# Patient Record
Sex: Female | Born: 1960 | Race: White | Hispanic: No | Marital: Single | Smoking: Never smoker
Health system: Southern US, Community
[De-identification: ages and names within clinical notes are randomized; demographics above are authoritative.]

## PROBLEM LIST (undated history)

## (undated) DIAGNOSIS — E119 Type 2 diabetes mellitus without complications: Secondary | ICD-10-CM

## (undated) DIAGNOSIS — K219 Gastro-esophageal reflux disease without esophagitis: Secondary | ICD-10-CM

## (undated) DIAGNOSIS — F329 Major depressive disorder, single episode, unspecified: Secondary | ICD-10-CM

## (undated) DIAGNOSIS — F419 Anxiety disorder, unspecified: Secondary | ICD-10-CM

## (undated) DIAGNOSIS — T783XXA Angioneurotic edema, initial encounter: Secondary | ICD-10-CM

## (undated) DIAGNOSIS — M1990 Unspecified osteoarthritis, unspecified site: Secondary | ICD-10-CM

## (undated) DIAGNOSIS — F32A Depression, unspecified: Secondary | ICD-10-CM

## (undated) DIAGNOSIS — G4730 Sleep apnea, unspecified: Secondary | ICD-10-CM

## (undated) DIAGNOSIS — I10 Essential (primary) hypertension: Secondary | ICD-10-CM

## (undated) DIAGNOSIS — D649 Anemia, unspecified: Secondary | ICD-10-CM

## (undated) DIAGNOSIS — L509 Urticaria, unspecified: Secondary | ICD-10-CM

## (undated) DIAGNOSIS — I499 Cardiac arrhythmia, unspecified: Secondary | ICD-10-CM

## (undated) DIAGNOSIS — R112 Nausea with vomiting, unspecified: Secondary | ICD-10-CM

## (undated) DIAGNOSIS — Z9889 Other specified postprocedural states: Secondary | ICD-10-CM

## (undated) DIAGNOSIS — C801 Malignant (primary) neoplasm, unspecified: Secondary | ICD-10-CM

## (undated) HISTORY — PX: CARPAL TUNNEL RELEASE: SHX101

## (undated) HISTORY — PX: BACK SURGERY: SHX140

## (undated) HISTORY — PX: TONSILLECTOMY: SUR1361

## (undated) HISTORY — PX: OTHER SURGICAL HISTORY: SHX169

## (undated) HISTORY — DX: Urticaria, unspecified: L50.9

## (undated) HISTORY — PX: KNEE ARTHROSCOPY: SUR90

## (undated) HISTORY — PX: OOPHORECTOMY: SHX86

## (undated) HISTORY — DX: Angioneurotic edema, initial encounter: T78.3XXA

---

## 2004-05-02 ENCOUNTER — Encounter: Admission: RE | Admit: 2004-05-02 | Discharge: 2004-05-02 | Payer: Self-pay | Admitting: Neurology

## 2006-04-30 ENCOUNTER — Inpatient Hospital Stay (HOSPITAL_COMMUNITY): Admission: RE | Admit: 2006-04-30 | Discharge: 2006-05-01 | Payer: Self-pay | Admitting: Neurosurgery

## 2006-07-15 ENCOUNTER — Encounter: Admission: RE | Admit: 2006-07-15 | Discharge: 2006-07-15 | Payer: Self-pay | Admitting: Neurosurgery

## 2007-08-03 ENCOUNTER — Ambulatory Visit: Payer: Self-pay | Admitting: Cardiology

## 2007-08-04 ENCOUNTER — Ambulatory Visit: Payer: Self-pay | Admitting: Cardiology

## 2007-08-10 ENCOUNTER — Ambulatory Visit: Payer: Self-pay | Admitting: Cardiology

## 2007-09-15 ENCOUNTER — Ambulatory Visit: Payer: Self-pay | Admitting: Cardiology

## 2008-11-09 DIAGNOSIS — E663 Overweight: Secondary | ICD-10-CM | POA: Insufficient documentation

## 2008-11-09 DIAGNOSIS — R9431 Abnormal electrocardiogram [ECG] [EKG]: Secondary | ICD-10-CM | POA: Insufficient documentation

## 2008-11-09 DIAGNOSIS — R002 Palpitations: Secondary | ICD-10-CM | POA: Insufficient documentation

## 2008-11-09 DIAGNOSIS — I10 Essential (primary) hypertension: Secondary | ICD-10-CM | POA: Insufficient documentation

## 2008-11-09 DIAGNOSIS — E7800 Pure hypercholesterolemia, unspecified: Secondary | ICD-10-CM | POA: Insufficient documentation

## 2010-06-11 NOTE — Assessment & Plan Note (Signed)
[facility]                          [facility] NOTE   NAME:Theresa Horton, Theresa Horton                      MRN:          [id number]  DATE:08/03/2007                            DOB:          Jun 17, 1960    HISTORY OF PRESENT ILLNESS:  Theresa Horton is 50 years old.  There is a  family history of coronary disease.  She is overweight.  She has  diabetes, hypertension, and elevated cholesterol.  The patient has noted  some mild palpitations.  She feels some heart skipping.  She has not  had any presyncope or syncope.  She has had some limited flutter, but no  prolonged episodes.  She had some of these symptoms last week, and  because she felt them a few days in a row, she saw [employer], PA-C.  She was stable at that point.  Her EKG at that time showed normal sinus  rhythm.  There were no diagnostic EKG changes.  She had some decreased R-  wave in V2.  There were nonspecific ST-T wave changes.  Since that time,  she feels better.  She is here for further evaluation of her overall  cardiac status.   She has not had exertional chest pain.  She does have some exertional  shortness of breath that she attributes to her weight.   The patient also had an episode last week of feeling a sudden sharp left  upper chest discomfort.  This lasted only for a second or two.  There  was some mild dizziness after this.  I did not feel like her usual  symptoms with decreased glucose.  However, she had a candy bar, and she  felt much better, and has had no return of this.   PAST MEDICAL HISTORY:   ALLERGIES:  NALFON, PENICILLIN, GLYBURIDE, GLIPIZIDE, and IODINE.   MEDICATIONS:  Metformin, Cyclessa, lisinopril, Celexa, Wellbutrin,  Ativan, Prilosec, aspirin, Neurontin, and fish oil.   OTHER MEDICAL PROBLEMS:  See the complete list below.   SOCIAL HISTORY:  The patient works with Therapist, sports at a desk.  She smoked very briefly in the remote past.  She is  not smoking at this  time.  She is divorced and single.   FAMILY HISTORY:  There is a family history of coronary disease that is  significant.  Father had first heart attack at age 51.   REVIEW OF SYSTEMS:  She does have some back discomfort.  This is related  to significant lumbar disk disease that she has had overtime.  Otherwise, her review of systems is negative.   PHYSICAL EXAMINATION:  VITAL SIGNS:  Weight is 309 pounds.  Blood  pressure is 144/86 with a pulse of 81.  GENERAL:  The patient is oriented to person, time, and place.  Affect is  normal.  HEENT:  No xanthelasma.  She has normal extraocular motion.  NECK:  There are no carotid bruits.  There is no jugular venous  distention.  LUNGS:  Clear.  Respiratory effort is not labored.  CARDIAC:  S1 with an S2.  There are no clicks or  significant murmurs.  ABDOMEN:  Soft.  There are no masses or bruits.  EXTREMITIES:  She has no significant peripheral edema.  She is  significantly overweight.   EKG sent to me from July 23, 2007 reveals sinus rhythm.  There are  nonspecific ST-T wave changes.  There is decreased R-wave in V2.   PROBLEMS:  1. History of hypertension.  2. Diabetes.  3. History of elevated cholesterol.  4. Status post back surgery, ruptured disk, and recurrent disk      problems.  5. History of knee surgery.  6. History of carpal tunnel surgery.  7. History of one ovary removed in the past.  8. Significant obesity.  9. Strong family history of coronary artery disease.  10.*Recent palpitations last week, which are now resolved.  I do feel      that we should do a 24-hour Holter monitor.  She does not need an      event recorder at this point.  This will help assess her overall      basic rhythm.  She will also have a 2-D echo to assess the left      ventricular function.  11.Mildly abnormal EKG.  A 2-D echo will help [place] assess the LV      function.  After I see her back, we will decide if further testing       is needed.     Luis Abed, MD, [facility]  Electronically Signed    JDK/MedQ  DD: 08/03/2007  DT: 08/04/2007  Job #: [id number]   cc:   Roma Kayser, PA-C

## 2010-06-11 NOTE — Assessment & Plan Note (Signed)
[facility]                          [facility]   NAME:Theresa Horton, Theresa Horton                      MRN:          [id number]  DATE:09/15/2007                            DOB:          Jan 22, 1961    Theresa Horton is seen for followup.  I saw seen her in consultation on August 03, 2007.  She has had some palpitations.  She has a family history of  coronary artery disease and she has diabetes.  She wear a Holter  monitor.  She did not have symptoms, but she had sinus rhythm throughout  the entire 24-hour period.  She had a 2-D echo.  She has good LV  function.  There was a question of some diastolic dysfunction and this  will have to be kept in mind over time.  There was also the possibility  of a small patent foramen ovale.  This was seen on a single apical view  with a possibility of slight left-to-right shunting.  This was seen by  color.  This was not a major abnormality.  A bubble study could be  considered, if clinically indicated.   She is back today in stable.  She tells me she still has some mild  palpitations, but overall she is doing well.   PAST MEDICAL HISTORY:   ALLERGIES:  NALFON, PENICILLIN, GLYBURIDE, GLIPIZIDE and IODINE.   MEDICATIONS:  Ativan, metformin, iron, lisinopril, Cyclessa (birth  control pill), Celexa, Wellbutrin, Prilosec, aspirin, Neurontin, fish  oil, and multivitamin.   OTHER MEDICAL PROBLEMS:  See the list on my note of August 03, 2007.   REVIEW OF SYSTEMS:  Review of systems today is negative.   PHYSICAL EXAMINATION:  GENERAL:  The patient is oriented to person,  time, and place.  Affect is normal.  VITAL SIGNS:  Her blood pressure is 126/84.  Her heart rate is 75.  Her  weight is 311 pounds.   Problems are listed on my note of August 03, 2007.  She has good LV  function.  There is question of a small patent foramen ovale, which  requires no further followup.  I have reviewed this with her completely.  She is on  low-dose aspirin.  I believe, she is stable.  I would like to  see her back in 1 year for Cardiology followup.  If she would have  significant change in her symptoms, I will be happy to see her at  anytime.  She clearly needs aggressive primary prevention for potential  vascular disease with her known diabetes history.     Luis Abed, MD, [facility]  Electronically Signed   JDK/MedQ  DD: 09/15/2007  DT: 09/16/2007  Job #: [id number]   cc:   Donzetta Sprung

## 2010-06-14 NOTE — Op Note (Signed)
Theresa Horton, Theresa Horton               ACCOUNT NO.:  [id number]   MEDICAL RECORD NO.:  [id number]          PATIENT TYPE:  INP   LOCATION:  2899                         FACILITY:  [facility]   PHYSICIAN:  Hewitt Shorts, M.D.DATE OF BIRTH:  01-13-1961   DATE OF PROCEDURE:  04/30/2006  DATE OF DISCHARGE:                               OPERATIVE REPORT   PREOPERATIVE DIAGNOSES:  1. Left L3-4 lumbar disk herniation.  2. Lumbar degenerative disk disease.  3. Lumbar spondylosis.  4. Lumbar radiculopathy.   POSTOPERATIVE DIAGNOSES:  1. Left L3-4 lumbar disk herniation.  2. Lumbar degenerative disk disease.  3. Lumbar spondylosis.  4. Lumbar radiculopathy.   PROCEDURE:  Left L3-4 lumbar laminotomy, microdiskectomy with  microdissection.   SURGEON:  Hewitt Shorts, M.D.   ASSISTANT:  1. Nelia Shi. Webb Silversmith, RN  2. Lovell Sheehan.   ANESTHESIA:  General endotracheal.   INDICATION:  The patient is a 50 year old woman who presented with an  acute left lumbar radiculopathy and had a large left L3-4 lumbar disk  herniation with a fragment that migrated caudally behind the body of L4.  The decision was made to proceed with elective laminotomy and  microdiskectomy.   PROCEDURE:  The patient was brought to the operating room, placed under  general endotracheal anesthesia.  The patient was turned to a prone  position.  Lumbar region was prepped with Betadine soap and solutio, and  draped in a sterile fashion.  The midline was infiltrated with local  anesthetic with epinephrine and localizing x-ray was taken and the L3-4  level identified.  A midline incision was made, carried down to the  subcutaneous tissue.  Bipolar cautery and electrocautery was used to  maintain hemostasis.  Dissection was carried down to the lumbar fascia  which was incised on the left side of the midline and the paraspinal  muscle were dissected of the spinous process and lamina in a  subperiosteal fashion.  A  self-retaining retractor was placed and an x-  ray was taken and the L3-4 intralaminar space identified.  The  microscope was draped and brought into the field to provide additional  magnification, illumination and visualization and the remainder of the  decompression and dissection was performed using microdissection and  microsurgical technique.  A laminotomy was performed using the XMax  drill and Kerrison punches.  The edges of the bone were waxed as needed.  The ligamentum flavum was carefully removed and we identified the thecal  sac and exiting left L4 nerve root.  These structures were retracted  medially and we identified a free fragment of disk herniation.  This was  removed in a piecemeal fashion.  There was a large rent in the annulus  and the disk space was entered and we proceeded with a thorough  diskectomy using a variety of pituitary rongeurs.  We then were able to  mobilize a very large fragmented disk that is herniated caudally behind  the body of L4.  With that, we were able to decompress the thecal sac  and exiting L4 nerve root.  We then continued the diskectomy within  the  disk space, removed all loose fragments of disk material from both the  disk space and the epidural space and in the end good decompression of  the thecal sac and nerve root was achieved.  Once the diskectomy was  completed, hemostasis was established with the use of bipolar cautery as  well as Gelfoam soaked in thrombin.  All of the Gelfoam was removed and  then we irrigated the wound with bacitracin solution and once again  checked for hemostasis which was confirmed, and then we instilled 2 mL  of fentanyl and 80 mg of Depo-Medrol into the epidural space and  proceeded with closure.  The deep fascia was closed with interrupted  undyed 1 Vicryl sutures.  The Scarpa fascia was closed with interrupted  undyed 1 Vicryl sutures.  Subcutaneous and subcuticular were closed with  interrupted inverted 2-0  and 3-0 undyed Vicryl sutures and the skin was  reapproximated with Dermabond.  The wound was dressed with Adaptic and  sterile gauze.  The procedure was tolerated well.  The estimated blood  loss was 50 mL.  Sponge and needle count were correct.   Following surgery, the patient was turned back to supine position to be  reversed from anesthetic, extubated and transferred to the recovery room  for further care.      Hewitt Shorts, M.D.  Electronically Signed     RWN/MEDQ  D:  04/30/2006  T:  04/30/2006  Job:  [id number]

## 2013-04-07 ENCOUNTER — Other Ambulatory Visit: Payer: Self-pay | Admitting: Orthopedic Surgery

## 2013-04-12 ENCOUNTER — Other Ambulatory Visit: Payer: Self-pay | Admitting: Orthopedic Surgery

## 2013-04-12 NOTE — H&P (Signed)
Theresa Horton DOB: 1960/09/30 Single / Language: Cleophus Molt / Race: White Female  Date of Admission:  05-11-2013  Chief Complaint:  Left hip pain  History of Present Illness The patient is a 53 year old female who comes in for a preoperative History and Physical. The patient is scheduled for a left total hip arthroplasty (anterior approach) to be performed by Dr. Dione Plover. Aluisio, MD at [facility] on 05-11-2013. The patient is a 53 year old female who presents with a hip problem. The patient was seen for a second opinion.The patient reports left hip problems including pain and stiffness symptoms that have been present for 2 year(s) (and 1/2). The symptoms began without any known injury. Prior to being seen, the patient was previously evaluated by a colleague (Dr. Gildardo Cranker) 2 year(s) ago (1/2). Symptoms present at the patient's previous evaluation included pain in the hip. Previous workup for this problem has included hip MRI. Previous treatment for this problem has included corticosteroid injection (on 08/21/10). Note for "Hip problem": She states she did not get much relief from the IA hip injection. She has had multiple back surgeries. She has had surgery on both knees, (arthroscopic) The hip has been progressively getting worse over a several year time span. She said it is hurting all the time now. It is definitely limiting what she can and cannot do. She has lost a significant amount of motion in that hip. The steroid injection she had a few years ago did not help at all. She has been loosing weight in attempt to see if that will help. She has lost over 100 pounds in the past year and a half. She says that has helped some, but she recently gained about 14 pounds back and the pain is still significant. In addition to the pain, she has had significant functional deficits because of the hip. She is ready to get the hip fixed. They have been treated conservatively in the past for  the above stated problem and despite conservative measures, they continue to have progressive pain and severe functional limitations and dysfunction. They have failed non-operative management including home exercise, medications, and injections. It is felt that they would benefit from undergoing total joint replacement. Risks and benefits of the procedure have been discussed with the patient and they elect to proceed with surgery. There are no active contraindications to surgery such as ongoing infection or rapidly progressive neurological disease.  Allergies NSAIDs. swelling Terbinafine HCl *DERMATOLOGICALS*. constipation Penicillin VK *PENICILLINS*. hives Lipitor *ANTIHYPERLIPIDEMICS*. myalgias Sulfanilamide *CHEMICALS*. thrush  Problem List/Past Medical Hip osteoarthritis (715.95) Rheumatoid Arthritis High blood pressure. Previously treated with RX meds but able to come off with weight loss Gastroesophageal Reflux Disease Hypercholesterolemia Diabetes Mellitus, Type II. Diet-controlled; Previously treated with RX meds but able to come off with weight loss Depression Anxiety Disorder Sleep Apnea. Previously treated with CPAP but able to come off with weight loss Hemorrhoids Menopause TMJ Syndrome. Right Side History of left foot drop (V13.59) following second back surgery  Family History First Degree Relatives. reported Chronic Obstructive Lung Disease. Mother. Heart disease in female family member before age 43 Heart Disease. Father. Cancer. Father. Kidney disease. Father. Rheumatoid Arthritis. Mother. Liver Disease, Chronic. Brother.   Social History Marital status. single Living situation. live alone Not under pain contract No history of drug/alcohol rehab Exercise. Exercises weekly; does other Children. 1 Current work status. disabled Current drinker. 01/28/2013: Currently drinks beer only occasionally per week Number of flights of stairs  before  winded. 4-5 Tobacco use. Never smoker. 01/28/2013 Tobacco / smoke exposure. 01/28/2013: yes   Past Surgical History Carpal Tunnel Repair. bilateral Arthroscopy of Knee. bilateral Tonsillectomy. Date: 1966. Spinal Surgery. Date: 1987. 2012 Lumbar Surgery. Date: 2008. Patient states that she developed a left foot drop following this surgery. Arthroscopic Knee Surgery - Both. Patient states she developed staoh infections and cellulitis following knee scope procedures. Oophorectomy. right  Review of Systems General:Present- Night Sweats. Not Present- Chills, Fever, Fatigue, Weight Gain, Weight Loss and Memory Loss. Skin:Not Present- Hives, Itching, Rash, Eczema and Lesions. HEENT:Not Present- Tinnitus, Headache, Double Vision, Visual Loss, Hearing Loss and Dentures. Respiratory:Not Present- Shortness of breath with exertion, Shortness of breath at rest, Allergies, Coughing up blood and Chronic Cough. Cardiovascular:Present- Palpitations. Not Present- Chest Pain, Racing/skipping heartbeats, Difficulty Breathing Lying Down, Murmur and Swelling. Gastrointestinal:Present- Constipation (chronic condition). Not Present- Bloody Stool, Heartburn, Abdominal Pain, Vomiting, Nausea, Diarrhea, Difficulty Swallowing, Jaundice and Loss of appetitie. Female Genitourinary:Not Present- Blood in Urine, Urinary frequency, Weak urinary stream, Discharge, Flank Pain, Incontinence, Painful Urination, Urgency, Urinary Retention and Urinating at Night. Musculoskeletal:Present- Joint Swelling, Joint Pain, Back Pain and Morning Stiffness. Not Present- Muscle Weakness, Muscle Pain and Spasms. Neurological:Not Present- Tremor, Dizziness, Blackout spells, Paralysis, Difficulty with balance and Weakness. Psychiatric:Not Present- Insomnia.    Vitals Weight: 228 lb Height: 66 in Body Surface Area: 2.19 m Body Mass Index: 36.8 kg/m Pulse: 84 (Regular) Resp.: 20 (Unlabored) BP:  132/86 (Sitting, Right Arm, Standard)   Physical Exam The physical exam findings are as follows:   General Mental Status - Alert, cooperative and good historian. General Appearance- pleasant and Anxious. Not in acute distress. Orientation- Oriented X3. Build & Nutrition- Well nourished and Well developed.   Head and Neck Head- normocephalic, atraumatic . Neck Global Assessment- supple. no bruit auscultated on the right and no bruit auscultated on the left.   Eye Vision- Wears corrective lenses. Pupil- Bilateral- Regular and Round. Motion- Bilateral- EOMI.   Chest and Lung Exam Auscultation: Breath sounds:- clear at anterior chest wall and - clear at posterior chest wall. Adventitious sounds:- No Adventitious sounds.   Cardiovascular Auscultation:Rhythm- Regular rate and rhythm. Heart Sounds- S1 WNL and S2 WNL. Murmurs & Other Heart Sounds:Auscultation of the heart reveals - No Murmurs.   Abdomen Inspection:Contour- Generalized mild distention. Palpation/Percussion:Tenderness- Abdomen is non-tender to palpation. Rigidity (guarding)- Abdomen is soft. Auscultation:Auscultation of the abdomen reveals - Bowel sounds normal.   Female Genitourinary Not done, not pertinent to present illness  Musculoskeletal  Her right hip has normal range of motion with no discomfort. Left his essentially fused. I can flex her to 90 degrees. She has no internal or external rotation and no abduction. She is noted to have some weakness with left foot dorsiflexion (preexisting foot drop - not complete) Her knee exam shows some crepitus with range of motion of both knees, but no tenderness or instability. Pulse, sensation, and motor intact. 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 She is completely bone on bone and has some erosion of the  femoral head superiorly as well as erosion of some of the superior acetabulum. Her right hip looks normal.   Assessment & Plan Hip osteoarthritis (715.95) Impression: Left Hip  Note: Plan is for a Left Total Hip Replacement - Anterior Approach by Dr. Wynelle Link.  Plan is to go home with mother.  PCP - Dr. Gar Ponto - Patient has been seen preoperatively and felt to be stable for surgery.  The patient does not have any contraindications and will receive TXA (tranexamic acid) prior to surgery.  Signed electronically by Joelene Millin, III PA-C

## 2013-04-26 ENCOUNTER — Encounter (HOSPITAL_COMMUNITY): Payer: Self-pay | Admitting: Pharmacy Technician

## 2013-05-02 NOTE — Patient Instructions (Signed)
Theresa Horton  05/02/2013   Your procedure is scheduled on:  05/11/13  215pm-400pm  Report to [facility] at      82  AM.  Call this number if you have problems the morning of surgery: [phone]   Remember:   Do not eat food or drink liquids after midnight.   Take these medicines the morning of surgery with A SIP OF WATER:    Do not wear jewelry, make-up or nail polish.  Do not wear lotions, powders, or perfumes.   Do not shave 48 hours prior to surgery.   Do not bring valuables to the hospital.  Contacts, dentures or bridgework may not be worn into surgery.  Leave suitcase in the car. After surgery it may be brought to your room.  For patients admitted to the hospital, checkout time is 11:00 AM the day of  discharge.    SEE CHG INSTRUCTION SHEET    Please read over the following fact sheets that you were given: MRSA Information            [facility] - Preparing for Surgery Before surgery, you can play an important role.  Because skin is not sterile, your skin needs to be as free of germs as possible.  You can reduce the number of germs on your skin by washing with CHG (chlorahexidine gluconate) soap before surgery.  CHG is an antiseptic cleaner which kills germs and bonds with the skin to continue killing germs even after washing. Please DO NOT use if you have an allergy to CHG or antibacterial soaps.  If your skin becomes reddened/irritated stop using the CHG and inform your nurse when you arrive at Short Stay. Do not shave (including legs and underarms) for at least 48 hours prior to the first CHG shower.  You may shave your face. Please follow these instructions carefully:  1.  Shower with CHG Soap the night before surgery and the  morning of Surgery.  2.  If you choose to wash your hair, wash your hair first as usual with your  normal  shampoo.  3.  After you shampoo, rinse your hair and body thoroughly to remove the  shampoo.                           4.   Use CHG as you would any other liquid soap.  You can apply chg directly  to the skin and wash                       Gently with a scrungie or clean washcloth.  5.  Apply the CHG Soap to your body ONLY FROM THE NECK DOWN.   Do not use on open                           Wound or open sores. Avoid contact with eyes, ears mouth and genitals (private parts).                        Genitals (private parts) with your normal soap.             6.  Wash thoroughly, paying special attention to the area where your surgery  will be performed.  7.  Thoroughly rinse your body with warm water from the neck down.  8.  DO NOT shower/wash  with your normal soap after using and rinsing off  the CHG Soap.                9.  Pat yourself dry with a clean towel.            10.  Wear clean pajamas.            11.  Place clean sheets on your bed the night of your first shower and do not  sleep with pets. Day of Surgery : Do not apply any lotions/deodorants the morning of surgery.  Please wear clean clothes to the hospital/surgery center.  FAILURE TO FOLLOW THESE INSTRUCTIONS MAY RESULT IN THE CANCELLATION OF YOUR SURGERY PATIENT SIGNATURE_________________________________  NURSE SIGNATURE__________________________________  WHAT IS A BLOOD TRANSFUSION? Blood Transfusion Information  A transfusion is the replacement of blood or some of its parts. Blood is made up of multiple cells which provide different functions.  Red blood cells carry oxygen and are used for blood loss replacement.  White blood cells fight against infection.  Platelets control bleeding.  Plasma helps clot blood.  Other blood products are available for specialized needs, such as hemophilia or other clotting disorders. BEFORE THE TRANSFUSION  Who gives blood for transfusions?   Healthy volunteers who are fully evaluated to make sure their blood is safe. This is blood bank blood. Transfusion therapy is the safest it has ever been in the  practice of medicine. Before blood is taken from a donor, a complete history is taken to make sure that person has no history of diseases nor engages in risky social behavior (examples are intravenous drug use or sexual activity with multiple partners). The donor's travel history is screened to minimize risk of transmitting infections, such as malaria. The donated blood is tested for signs of infectious diseases, such as HIV and hepatitis. The blood is then tested to be sure it is compatible with you in order to minimize the chance of a transfusion reaction. If you or a relative donates blood, this is often done in anticipation of surgery and is not appropriate for emergency situations. It takes many days to process the donated blood. RISKS AND COMPLICATIONS Although transfusion therapy is very safe and saves many lives, the main dangers of transfusion include:   Getting an infectious disease.  Developing a transfusion reaction. This is an allergic reaction to something in the blood you were given. Every precaution is taken to prevent this. The decision to have a blood transfusion has been considered carefully by your caregiver before blood is given. Blood is not given unless the benefits outweigh the risks. AFTER THE TRANSFUSION  Right after receiving a blood transfusion, you will usually feel much better and more energetic. This is especially true if your red blood cells have gotten low (anemic). The transfusion raises the level of the red blood cells which carry oxygen, and this usually causes an energy increase.  The nurse administering the transfusion will monitor you carefully for complications. HOME CARE INSTRUCTIONS  No special instructions are needed after a transfusion. You may find your energy is better. Speak with your caregiver about any limitations on activity for underlying diseases you may have. SEEK MEDICAL CARE IF:   Your condition is not improving after your transfusion.  You  develop redness or irritation at the intravenous (IV) site. SEEK IMMEDIATE MEDICAL CARE IF:  Any of the following symptoms occur over the next 12 hours:  Shaking chills.  You have a temperature by mouth  above 102 F (38.9 C), not controlled by medicine.  Chest, back, or muscle pain.  People around you feel you are not acting correctly or are confused.  Shortness of breath or difficulty breathing.  Dizziness and fainting.  You get a rash or develop hives.  You have a decrease in urine output.  Your urine turns a dark color or changes to pink, red, or brown. Any of the following symptoms occur over the next 10 days:  You have a temperature by mouth above 102 F (38.9 C), not controlled by medicine.  Shortness of breath.  Weakness after normal activity.  The white part of the eye turns yellow (jaundice).  You have a decrease in the amount of urine or are urinating less often.  Your urine turns a dark color or changes to pink, red, or brown. Document Released: 01/11/2000 Document Revised: 04/07/2011 Document Reviewed: 08/30/2007 [facility] Patient Information 2014 [place].   Incentive Spirometer  An incentive spirometer is a tool that can help keep your lungs clear and active. This tool measures how well you are filling your lungs with each breath. Taking long deep breaths may help reverse or decrease the chance of developing breathing (pulmonary) problems (especially infection) following:  A long period of time when you are unable to move or be active. BEFORE THE PROCEDURE   If the spirometer includes an indicator to show your best effort, your nurse or respiratory therapist will set it to a desired goal.  If possible, sit up straight or lean slightly forward. Try not to slouch.  Hold the incentive spirometer in an upright position. INSTRUCTIONS FOR USE  1. Sit on the edge of your bed if possible, or sit up as far as you can in bed or on a chair. 2. Hold the  incentive spirometer in an upright position. 3. Breathe out normally. 4. Place the mouthpiece in your mouth and seal your lips tightly around it. 5. Breathe in slowly and as deeply as possible, raising the piston or the ball toward the top of the column. 6. Hold your breath for 3-5 seconds or for as long as possible. Allow the piston or ball to fall to the bottom of the column. 7. Remove the mouthpiece from your mouth and breathe out normally. 8. Rest for a few seconds and repeat Steps 1 through 7 at least 10 times every 1-2 hours when you are awake. Take your time and take a few normal breaths between deep breaths. 9. The spirometer may include an indicator to show your best effort. Use the indicator as a goal to work toward during each repetition. 10. After each set of 10 deep breaths, practice coughing to be sure your lungs are clear. If you have an incision (the cut made at the time of surgery), support your incision when coughing by placing a pillow or rolled up towels firmly against it. Once you are able to get out of bed, walk around indoors and cough well. You may stop using the incentive spirometer when instructed by your caregiver.  RISKS AND COMPLICATIONS  Take your time so you do not get dizzy or light-headed.  If you are in pain, you may need to take or ask for pain medication before doing incentive spirometry. It is harder to take a deep breath if you are having pain. AFTER USE  Rest and breathe slowly and easily.  It can be helpful to keep track of a log of your progress. Your caregiver can provide you  with a simple table to help with this. If you are using the spirometer at home, follow these instructions: [place] IF:   You are having difficultly using the spirometer.  You have trouble using the spirometer as often as instructed.  Your pain medication is not giving enough relief while using the spirometer.  You develop fever of 100.5 F (38.1 C) or  higher. SEEK IMMEDIATE MEDICAL CARE IF:   You cough up bloody sputum that had not been present before.  You develop fever of 102 F (38.9 C) or greater.  You develop worsening pain at or near the incision site. MAKE SURE YOU:   Understand these instructions.  Will watch your condition.  Will get help right away if you are not doing well or get worse. Document Released: 05/26/2006 Document Revised: 04/07/2011 Document Reviewed: 07/27/2006 [facility] Patient Information 2014 [place], [place].

## 2013-05-03 ENCOUNTER — Encounter (HOSPITAL_COMMUNITY): Payer: Self-pay

## 2013-05-03 ENCOUNTER — Other Ambulatory Visit: Payer: Self-pay

## 2013-05-03 ENCOUNTER — Ambulatory Visit (HOSPITAL_COMMUNITY)
Admission: RE | Admit: 2013-05-03 | Discharge: 2013-05-03 | Disposition: A | Discharge status: Home / Self Care | Payer: Medicare PPO | Source: Ambulatory Visit | Attending: Orthopedic Surgery | Admitting: Orthopedic Surgery

## 2013-05-03 ENCOUNTER — Encounter (INDEPENDENT_AMBULATORY_CARE_PROVIDER_SITE_OTHER): Payer: Self-pay

## 2013-05-03 ENCOUNTER — Encounter (HOSPITAL_COMMUNITY)
Admission: RE | Admit: 2013-05-03 | Discharge: 2013-05-03 | Disposition: A | Discharge status: Home / Self Care | Payer: Medicare PPO | Source: Ambulatory Visit | Attending: Orthopedic Surgery | Admitting: Orthopedic Surgery

## 2013-05-03 DIAGNOSIS — M1610 Unilateral primary osteoarthritis, unspecified hip: Secondary | ICD-10-CM | POA: Insufficient documentation

## 2013-05-03 DIAGNOSIS — Z01812 Encounter for preprocedural laboratory examination: Secondary | ICD-10-CM | POA: Insufficient documentation

## 2013-05-03 DIAGNOSIS — M169 Osteoarthritis of hip, unspecified: Secondary | ICD-10-CM | POA: Insufficient documentation

## 2013-05-03 DIAGNOSIS — Z01818 Encounter for other preprocedural examination: Secondary | ICD-10-CM | POA: Insufficient documentation

## 2013-05-03 DIAGNOSIS — Z01810 Encounter for preprocedural cardiovascular examination: Secondary | ICD-10-CM | POA: Insufficient documentation

## 2013-05-03 HISTORY — DX: Type 2 diabetes mellitus without complications: E11.9

## 2013-05-03 HISTORY — DX: Depression, unspecified: F32.A

## 2013-05-03 HISTORY — DX: Unspecified osteoarthritis, unspecified site: M19.90

## 2013-05-03 HISTORY — DX: Cardiac arrhythmia, unspecified: I49.9

## 2013-05-03 HISTORY — DX: Anxiety disorder, unspecified: F41.9

## 2013-05-03 HISTORY — DX: Major depressive disorder, single episode, unspecified: F32.9

## 2013-05-03 HISTORY — DX: Malignant (primary) neoplasm, unspecified: C80.1

## 2013-05-03 HISTORY — DX: Anemia, unspecified: D64.9

## 2013-05-03 HISTORY — DX: Other specified postprocedural states: R11.2

## 2013-05-03 HISTORY — DX: Sleep apnea, unspecified: G47.30

## 2013-05-03 HISTORY — DX: Essential (primary) hypertension: I10

## 2013-05-03 HISTORY — DX: Other specified postprocedural states: Z98.890

## 2013-05-03 HISTORY — DX: Gastro-esophageal reflux disease without esophagitis: K21.9

## 2013-05-03 LAB — CBC
HCT: 36.6 % (ref 36.0–46.0)
Hemoglobin: 11.8 g/dL — ABNORMAL LOW (ref 12.0–15.0)
MCH: 28.1 pg (ref 26.0–34.0)
MCHC: 32.2 g/dL (ref 30.0–36.0)
MCV: 87.1 fL (ref 78.0–100.0)
PLATELETS: 384 10*3/uL (ref 150–400)
RBC: 4.2 MIL/uL (ref 3.87–5.11)
RDW: 13.9 % (ref 11.5–15.5)
WBC: 8.1 10*3/uL (ref 4.0–10.5)

## 2013-05-03 LAB — SURGICAL PCR SCREEN
MRSA, PCR: NEGATIVE
STAPHYLOCOCCUS AUREUS: POSITIVE — AB

## 2013-05-03 LAB — COMPREHENSIVE METABOLIC PANEL
ALK PHOS: 68 U/L (ref 39–117)
ALT: 11 U/L (ref 0–35)
AST: 12 U/L (ref 0–37)
Albumin: 3.2 g/dL — ABNORMAL LOW (ref 3.5–5.2)
BUN: 16 mg/dL (ref 6–23)
CO2: 22 mEq/L (ref 19–32)
Calcium: 9.1 mg/dL (ref 8.4–10.5)
Chloride: 101 mEq/L (ref 96–112)
Creatinine, Ser: 0.88 mg/dL (ref 0.50–1.10)
GFR calc Af Amer: 85 mL/min — ABNORMAL LOW (ref >90)
GFR calc non Af Amer: 74 mL/min — ABNORMAL LOW (ref >90)
Glucose, Bld: 158 mg/dL — ABNORMAL HIGH (ref 70–99)
POTASSIUM: 3.9 meq/L (ref 3.7–5.3)
SODIUM: 139 meq/L (ref 137–147)
TOTAL PROTEIN: 6.8 g/dL (ref 6.0–8.3)
Total Bilirubin: <0.2 mg/dL — ABNORMAL LOW (ref 0.3–1.2)

## 2013-05-03 LAB — ABO/RH: ABO/RH(D): A POS

## 2013-05-03 LAB — HCG, SERUM, QUALITATIVE: Preg, Serum: NEGATIVE

## 2013-05-03 LAB — URINALYSIS, ROUTINE W REFLEX MICROSCOPIC
Glucose, UA: NEGATIVE mg/dL
Hgb urine dipstick: NEGATIVE
Ketones, ur: NEGATIVE mg/dL
Leukocytes, UA: NEGATIVE
Nitrite: NEGATIVE
Protein, ur: NEGATIVE mg/dL
SPECIFIC GRAVITY, URINE: 1.036 — AB (ref 1.005–1.030)
UROBILINOGEN UA: 0.2 mg/dL (ref 0.0–1.0)
pH: 5.5 (ref 5.0–8.0)

## 2013-05-03 LAB — APTT: APTT: 29 s (ref 24–37)

## 2013-05-03 LAB — PROTIME-INR
INR: 0.98 (ref 0.00–1.49)
Prothrombin Time: 12.8 seconds (ref 11.6–15.2)

## 2013-05-03 NOTE — Progress Notes (Signed)
Clearance note on chart from Dr Quillian Quince.

## 2013-05-05 NOTE — Progress Notes (Signed)
Penicillins causes hives per patient at time of preop appointment.  Ancef is ordered preop.

## 2013-05-10 ENCOUNTER — Other Ambulatory Visit: Payer: Self-pay | Admitting: Orthopedic Surgery

## 2013-05-10 NOTE — H&P (Signed)
Theresa Horton DOB: Apr 23, 1960 Single / Language: Cleophus Molt / Race: White Female  Date of Admission:  05-11-2013  Chief Complaint:  Left Hip Pain  History of Present Illness The patient is a 53 year old female who comes in for a preoperative History and Physical. The patient is scheduled for a left total hip arthroplasty (anterior approach) to be performed by Dr. Dione Plover. Aluisio, MD at [facility] on 05-11-2013. The patient is a 53 year old female who presents with a hip problem. The patient was seen for a second opinion.The patient reports left hip problems including pain and stiffness symptoms that have been present for 2 year(s) (and 1/2). The symptoms began without any known injury. Prior to being seen today the patient was previously evaluated by a colleague (Dr. Gildardo Cranker) 2 year(s) ago (1/2). Symptoms present at the patient's previous evaluation included pain in the hip. Previous workup for this problem has included hip MRI. Previous treatment for this problem has included corticosteroid injection (on 08/21/10). Note for "Hip problem": She states she did not get much relief from the IA hip injection. She has had multiple back surgeries. She has had surgery on both knees, (arthroscopic) The hip has been progressively getting worse over a several year time span. She said it is hurting all the time now. It is definitely limiting what she can and cannot do. She has lost a significant amount of motion in that hip. The steroid injection she had a few years ago did not help at all. She has been loosing weight in attempt to see if that will help. She has lost over 100 pounds in the past year and a half. She says that has helped some, but she recently gained about 14 pounds back and the pain is still significant. In addition to the pain, she has had significant functional deficits because of the hip. She is ready to get the hip fixed. They have been treated conservatively in the  past for the above stated problem and despite conservative measures, they continue to have progressive pain and severe functional limitations and dysfunction. They have failed non-operative management including home exercise, medications, and injections. It is felt that they would benefit from undergoing total joint replacement. Risks and benefits of the procedure have been discussed with the patient and they elect to proceed with surgery. There are no active contraindications to surgery such as ongoing infection or rapidly progressive neurological disease.   Allergies NSAIDs. swelling Terbinafine HCl *DERMATOLOGICALS*. constipation Penicillin VK *PENICILLINS*. hives Lipitor *ANTIHYPERLIPIDEMICS*. myalgias Sulfanilamide *CHEMICALS*. thrush   Problem List/Past Medical Hip osteoarthritis (715.95) History of left foot drop (V13.59) Rheumatoid Arthritis High blood pressure. Previously treated with RX meds but able to come off with weight loss Gastroesophageal Reflux Disease Hypercholesterolemia Diabetes Mellitus, Type II. Diet-controlled; Previously treated with RX meds but able to come off with weight loss Depression Anxiety Disorder Oophorectomy. right Sleep Apnea. Previously treated with CPAP but able to come off with weight loss Hemorrhoids Menopause TMJ Syndrome. Right Side   Family History First Degree Relatives. reported Chronic Obstructive Lung Disease. Mother. Heart disease in female family member before age 17 Heart Disease. Father. Cancer. Father. Kidney disease. Father. Rheumatoid Arthritis. Mother. Liver Disease, Chronic. Brother.   Social History Marital status. single Living situation. live alone Not under pain contract No history of drug/alcohol rehab Exercise. Exercises weekly; does other Children. 1 Current work status. disabled Current drinker. 01/28/2013: Currently drinks beer only occasionally per week Number of flights of  stairs before winded. 4-5 Tobacco use. Never smoker. 01/28/2013 Tobacco / smoke exposure. 01/28/2013: yes   Past Surgical History Carpal Tunnel Repair. bilateral Arthroscopy of Knee. bilateral Tonsillectomy. Date: 1966. Spinal Surgery. Date: 1987. 2012 Lumbar Surgery. Date: 2008. Patient states that she developed a left foot drop following this surgery. Arthroscopic Knee Surgery - Both. Patient states she developed staoh infections and cellulitis following knee scope procedures.   Review of Systems General:Present- Night Sweats. Not Present- Chills, Fever, Fatigue, Weight Gain, Weight Loss and Memory Loss. Skin:Not Present- Hives, Itching, Rash, Eczema and Lesions. HEENT:Not Present- Tinnitus, Headache, Double Vision, Visual Loss, Hearing Loss and Dentures. Respiratory:Not Present- Shortness of breath with exertion, Shortness of breath at rest, Allergies, Coughing up blood and Chronic Cough. Cardiovascular:Present- Palpitations. Not Present- Chest Pain, Racing/skipping heartbeats, Difficulty Breathing Lying Down, Murmur and Swelling. Gastrointestinal:Present- Constipation (chronic condition). Not Present- Bloody Stool, Heartburn, Abdominal Pain, Vomiting, Nausea, Diarrhea, Difficulty Swallowing, Jaundice and Loss of appetitie. Female Genitourinary:Not Present- Blood in Urine, Urinary frequency, Weak urinary stream, Discharge, Flank Pain, Incontinence, Painful Urination, Urgency, Urinary Retention and Urinating at Night. Musculoskeletal:Present- Joint Swelling, Joint Pain, Back Pain and Morning Stiffness. Not Present- Muscle Weakness, Muscle Pain and Spasms. Neurological:Not Present- Tremor, Dizziness, Blackout spells, Paralysis, Difficulty with balance and Weakness. Psychiatric:Not Present- Insomnia.    Vitals Weight: 228 lb Height: 66 in Weight was reported by patient. Height was reported by patient. Body Surface Area: 2.19 m Body Mass Index: 36.8  kg/m Pulse: 84 (Regular) Resp.: 20 (Unlabored) BP: 132/86 (Sitting, Right Arm, Standard)     Physical Exam The physical exam findings are as follows:   General Mental Status - Alert, cooperative and good historian. General Appearance- pleasant and Anxious. Not in acute distress. Orientation- Oriented X3. Build & Nutrition- Well nourished and Well developed.   Head and Neck Head- normocephalic, atraumatic . Neck Global Assessment- supple. no bruit auscultated on the right and no bruit auscultated on the left.   Eye Vision- Wears corrective lenses. Pupil- Bilateral- Regular and Round. Motion- Bilateral- EOMI.   Chest and Lung Exam Auscultation: Breath sounds:- clear at anterior chest wall and - clear at posterior chest wall. Adventitious sounds:- No Adventitious sounds.   Cardiovascular Auscultation:Rhythm- Regular rate and rhythm. Heart Sounds- S1 WNL and S2 WNL. Murmurs & Other Heart Sounds:Auscultation of the heart reveals - No Murmurs.   Abdomen Inspection:Contour- Generalized mild distention. Palpation/Percussion:Tenderness- Abdomen is non-tender to palpation. Rigidity (guarding)- Abdomen is soft. Auscultation:Auscultation of the abdomen reveals - Bowel sounds normal.   Female Genitourinary Not done, not pertinent to present illness  Musculoskeletal Her right hip has normal range of motion with no discomfort. Left his essentially fused. I can flex her to 90 degrees. She has no internal or external rotation and no abduction. She is noted to have some weakness with left foot dorsiflexion (preexisting foot drop - not complete) Her knee exam shows some crepitus with range of motion of both knees, but no tenderness or instability. Pulse, sensation, and motor intact. She has a significantly antalgic gait pattern  RADIOGRAPHS AP of pelvis and lateral of the left hip show that she has essentially no hip left on the left hand  side. She is completely bone on bone and has some erosion of the femoral head superiorly as well as erosion of some of the superior acetabulum. Her right hip looks normal.   Assessment & Plan Hip osteoarthritis (715.95) Impression: Left Hip  Note: Plan is for a Left Total Hip Replacement - Anterior Approach   by Dr. Aluisio.  Plan is to go home with mother.  PCP - Dr. Terry Daniel - Patient has been seen preoperatively and felt to be stable for surgery.  The patient does not have any contraindications and will receive TXA (tranexamic acid) prior to surgery.  Signed electronically by Alexzandrew L Perkins, III PA-C  

## 2013-05-11 ENCOUNTER — Inpatient Hospital Stay (HOSPITAL_COMMUNITY): Payer: Medicare PPO

## 2013-05-11 ENCOUNTER — Encounter (HOSPITAL_COMMUNITY): Payer: Medicare PPO | Admitting: Anesthesiology

## 2013-05-11 ENCOUNTER — Encounter (HOSPITAL_COMMUNITY): Payer: Self-pay | Admitting: *Deleted

## 2013-05-11 ENCOUNTER — Inpatient Hospital Stay (HOSPITAL_COMMUNITY): Payer: Medicare PPO | Admitting: Anesthesiology

## 2013-05-11 ENCOUNTER — Inpatient Hospital Stay (HOSPITAL_COMMUNITY)
Admission: RE | Admit: 2013-05-11 | Discharge: 2013-05-13 | DRG: 470 | Disposition: A | Discharge status: Home Health | Payer: Medicare PPO | Source: Ambulatory Visit | Attending: Orthopedic Surgery | Admitting: Orthopedic Surgery

## 2013-05-11 ENCOUNTER — Encounter (HOSPITAL_COMMUNITY)
Admission: RE | Disposition: A | Discharge status: Home Health | Payer: Self-pay | Source: Ambulatory Visit | Attending: Orthopedic Surgery

## 2013-05-11 DIAGNOSIS — E119 Type 2 diabetes mellitus without complications: Secondary | ICD-10-CM | POA: Diagnosis present

## 2013-05-11 DIAGNOSIS — Z6836 Body mass index (BMI) 36.0-36.9, adult: Secondary | ICD-10-CM

## 2013-05-11 DIAGNOSIS — Z96642 Presence of left artificial hip joint: Secondary | ICD-10-CM

## 2013-05-11 DIAGNOSIS — F411 Generalized anxiety disorder: Secondary | ICD-10-CM | POA: Diagnosis present

## 2013-05-11 DIAGNOSIS — Z6838 Body mass index (BMI) 38.0-38.9, adult: Secondary | ICD-10-CM

## 2013-05-11 DIAGNOSIS — F329 Major depressive disorder, single episode, unspecified: Secondary | ICD-10-CM | POA: Diagnosis present

## 2013-05-11 DIAGNOSIS — I10 Essential (primary) hypertension: Secondary | ICD-10-CM | POA: Diagnosis present

## 2013-05-11 DIAGNOSIS — M216X9 Other acquired deformities of unspecified foot: Secondary | ICD-10-CM | POA: Diagnosis present

## 2013-05-11 DIAGNOSIS — M1610 Unilateral primary osteoarthritis, unspecified hip: Principal | ICD-10-CM | POA: Diagnosis present

## 2013-05-11 DIAGNOSIS — K219 Gastro-esophageal reflux disease without esophagitis: Secondary | ICD-10-CM | POA: Diagnosis present

## 2013-05-11 DIAGNOSIS — M169 Osteoarthritis of hip, unspecified: Secondary | ICD-10-CM | POA: Diagnosis present

## 2013-05-11 DIAGNOSIS — F3289 Other specified depressive episodes: Secondary | ICD-10-CM | POA: Diagnosis present

## 2013-05-11 HISTORY — PX: TOTAL HIP ARTHROPLASTY: SHX124

## 2013-05-11 LAB — GLUCOSE, CAPILLARY
Glucose-Capillary: 95 mg/dL (ref 70–99)
Glucose-Capillary: 105 mg/dL — ABNORMAL HIGH (ref 70–99)

## 2013-05-11 LAB — TYPE AND SCREEN
ABO/RH(D): A POS
Antibody Screen: NEGATIVE

## 2013-05-11 SURGERY — ARTHROPLASTY, HIP, TOTAL, ANTERIOR APPROACH
Anesthesia: General | Site: Hip | Laterality: Left

## 2013-05-11 MED ORDER — ONDANSETRON HCL 4 MG PO TABS
4.0000 mg | ORAL_TABLET | Freq: Four times a day (QID) | ORAL | Status: DC | PRN
Start: 2013-05-11 — End: 2013-05-13

## 2013-05-11 MED ORDER — DEXAMETHASONE SODIUM PHOSPHATE 10 MG/ML IJ SOLN
INTRAMUSCULAR | Status: AC
  Filled 2013-05-11: qty 1

## 2013-05-11 MED ORDER — ONDANSETRON HCL 4 MG/2ML IJ SOLN
INTRAMUSCULAR | Status: AC
  Filled 2013-05-11: qty 2

## 2013-05-11 MED ORDER — BUPIVACAINE HCL (PF) 0.25 % IJ SOLN
INTRAMUSCULAR | Status: AC
  Filled 2013-05-11: qty 30

## 2013-05-11 MED ORDER — ACETAMINOPHEN 500 MG PO TABS
1000.0000 mg | ORAL_TABLET | Freq: Four times a day (QID) | ORAL | Status: AC
  Administered 2013-05-11 – 2013-05-12 (×4): 1000 mg via ORAL
  Filled 2013-05-11 (×5): qty 2

## 2013-05-11 MED ORDER — BUPIVACAINE HCL (PF) 0.25 % IJ SOLN
INTRAMUSCULAR | Status: DC | PRN
  Administered 2013-05-11: 20 mL

## 2013-05-11 MED ORDER — NEOSTIGMINE METHYLSULFATE 1 MG/ML IJ SOLN
INTRAMUSCULAR | Status: DC | PRN
  Administered 2013-05-11: 4 mg via INTRAVENOUS

## 2013-05-11 MED ORDER — TRANEXAMIC ACID 100 MG/ML IV SOLN
1000.0000 mg | INTRAVENOUS | Status: AC
  Administered 2013-05-11: 1000 mg via INTRAVENOUS
  Filled 2013-05-11: qty 10

## 2013-05-11 MED ORDER — SERTRALINE HCL 100 MG PO TABS
100.0000 mg | ORAL_TABLET | Freq: Every morning | ORAL | Status: DC
  Administered 2013-05-12 – 2013-05-13 (×2): 100 mg via ORAL
  Filled 2013-05-11 (×2): qty 1

## 2013-05-11 MED ORDER — METOCLOPRAMIDE HCL 10 MG PO TABS: 5.0000 mg | ORAL_TABLET | Freq: Three times a day (TID) | ORAL | Status: DC | PRN

## 2013-05-11 MED ORDER — ONDANSETRON HCL 4 MG/2ML IJ SOLN
INTRAMUSCULAR | Status: DC | PRN
  Administered 2013-05-11: 4 mg via INTRAVENOUS

## 2013-05-11 MED ORDER — HYDROMORPHONE HCL PF 1 MG/ML IJ SOLN
INTRAMUSCULAR | Status: AC
  Filled 2013-05-11: qty 1

## 2013-05-11 MED ORDER — ACETAMINOPHEN 10 MG/ML IV SOLN
1000.0000 mg | Freq: Once | INTRAVENOUS | Status: AC
  Administered 2013-05-11: 1000 mg via INTRAVENOUS
  Filled 2013-05-11: qty 100

## 2013-05-11 MED ORDER — SODIUM CHLORIDE 0.9 % IJ SOLN
INTRAMUSCULAR | Status: DC | PRN
  Administered 2013-05-11: 30 mL

## 2013-05-11 MED ORDER — DEXAMETHASONE SODIUM PHOSPHATE 10 MG/ML IJ SOLN
10.0000 mg | Freq: Every day | INTRAMUSCULAR | Status: AC
  Filled 2013-05-11: qty 1

## 2013-05-11 MED ORDER — MIDAZOLAM HCL 2 MG/2ML IJ SOLN
INTRAMUSCULAR | Status: AC
  Filled 2013-05-11: qty 2

## 2013-05-11 MED ORDER — GLYCOPYRROLATE 0.2 MG/ML IJ SOLN
INTRAMUSCULAR | Status: DC | PRN
  Administered 2013-05-11: .6 mg via INTRAVENOUS

## 2013-05-11 MED ORDER — METHOCARBAMOL 500 MG PO TABS
500.0000 mg | ORAL_TABLET | Freq: Four times a day (QID) | ORAL | Status: DC | PRN
  Filled 2013-05-11 (×3): qty 1

## 2013-05-11 MED ORDER — SODIUM CHLORIDE 0.9 % IJ SOLN
INTRAMUSCULAR | Status: AC
  Filled 2013-05-11: qty 50

## 2013-05-11 MED ORDER — 0.9 % SODIUM CHLORIDE (POUR BTL) OPTIME
TOPICAL | Status: DC | PRN
  Administered 2013-05-11: 1000 mL

## 2013-05-11 MED ORDER — DIPHENHYDRAMINE HCL 12.5 MG/5ML PO ELIX: 12.5000 mg | ORAL_SOLUTION | ORAL | Status: DC | PRN

## 2013-05-11 MED ORDER — POLYETHYLENE GLYCOL 3350 17 G PO PACK: 17.0000 g | PACK | Freq: Every day | ORAL | Status: DC | PRN

## 2013-05-11 MED ORDER — DEXAMETHASONE SODIUM PHOSPHATE 10 MG/ML IJ SOLN
INTRAMUSCULAR | Status: DC | PRN
  Administered 2013-05-11: 8 mg via INTRAVENOUS

## 2013-05-11 MED ORDER — PHENYLEPHRINE 40 MCG/ML (10ML) SYRINGE FOR IV PUSH (FOR BLOOD PRESSURE SUPPORT)
PREFILLED_SYRINGE | INTRAVENOUS | Status: AC
  Filled 2013-05-11: qty 10

## 2013-05-11 MED ORDER — FLEET ENEMA 7-19 GM/118ML RE ENEM: 1.0000 | ENEMA | Freq: Once | RECTAL | Status: AC | PRN

## 2013-05-11 MED ORDER — MENTHOL 3 MG MT LOZG
1.0000 | LOZENGE | OROMUCOSAL | Status: DC | PRN
  Filled 2013-05-11: qty 9

## 2013-05-11 MED ORDER — VANCOMYCIN HCL 10 G IV SOLR
1500.0000 mg | INTRAVENOUS | Status: AC
  Administered 2013-05-11: 1500 mg via INTRAVENOUS
  Filled 2013-05-11: qty 1500

## 2013-05-11 MED ORDER — SUFENTANIL CITRATE 50 MCG/ML IV SOLN
INTRAVENOUS | Status: DC | PRN
  Administered 2013-05-11 (×3): 10 ug via INTRAVENOUS
  Administered 2013-05-11: 20 ug via INTRAVENOUS

## 2013-05-11 MED ORDER — LIDOCAINE HCL (CARDIAC) 20 MG/ML IV SOLN
INTRAVENOUS | Status: DC | PRN
  Administered 2013-05-11: 80 mg via INTRAVENOUS

## 2013-05-11 MED ORDER — PANTOPRAZOLE SODIUM 40 MG PO TBEC: 40.0000 mg | DELAYED_RELEASE_TABLET | Freq: Every day | ORAL | Status: DC | PRN

## 2013-05-11 MED ORDER — PROPOFOL 10 MG/ML IV BOLUS
INTRAVENOUS | Status: DC | PRN
  Administered 2013-05-11: 180 mg via INTRAVENOUS

## 2013-05-11 MED ORDER — CISATRACURIUM BESYLATE 20 MG/10ML IV SOLN
INTRAVENOUS | Status: AC
  Filled 2013-05-11: qty 10

## 2013-05-11 MED ORDER — ACETAMINOPHEN 325 MG PO TABS
650.0000 mg | ORAL_TABLET | Freq: Four times a day (QID) | ORAL | Status: DC | PRN
  Administered 2013-05-13: 650 mg via ORAL
  Filled 2013-05-11: qty 2

## 2013-05-11 MED ORDER — KETAMINE HCL 10 MG/ML IJ SOLN
INTRAMUSCULAR | Status: DC | PRN
  Administered 2013-05-11 (×2): 10 mg via INTRAVENOUS
  Administered 2013-05-11: 30 mg via INTRAVENOUS

## 2013-05-11 MED ORDER — DOCUSATE SODIUM 100 MG PO CAPS
100.0000 mg | ORAL_CAPSULE | Freq: Two times a day (BID) | ORAL | Status: DC
  Administered 2013-05-11 – 2013-05-13 (×4): 100 mg via ORAL

## 2013-05-11 MED ORDER — PROPOFOL 10 MG/ML IV BOLUS
INTRAVENOUS | Status: AC
Start: 2013-05-11 — End: 2013-05-11
  Filled 2013-05-11: qty 20

## 2013-05-11 MED ORDER — HYDROMORPHONE HCL PF 2 MG/ML IJ SOLN
INTRAMUSCULAR | Status: AC
  Filled 2013-05-11: qty 1

## 2013-05-11 MED ORDER — MIDAZOLAM HCL 5 MG/5ML IJ SOLN
INTRAMUSCULAR | Status: DC | PRN
  Administered 2013-05-11: 1 mg via INTRAVENOUS

## 2013-05-11 MED ORDER — MORPHINE SULFATE 2 MG/ML IJ SOLN
1.0000 mg | INTRAMUSCULAR | Status: DC | PRN
  Filled 2013-05-11: qty 1

## 2013-05-11 MED ORDER — BISACODYL 10 MG RE SUPP: 10.0000 mg | Freq: Every day | RECTAL | Status: DC | PRN

## 2013-05-11 MED ORDER — CHLORHEXIDINE GLUCONATE 4 % EX LIQD: 60.0000 mL | Freq: Once | CUTANEOUS | Status: DC

## 2013-05-11 MED ORDER — CISATRACURIUM BESYLATE (PF) 10 MG/5ML IV SOLN
INTRAVENOUS | Status: DC | PRN
  Administered 2013-05-11: 6 mg via INTRAVENOUS
  Administered 2013-05-11: 2 mg via INTRAVENOUS

## 2013-05-11 MED ORDER — SODIUM CHLORIDE 0.9 % IJ SOLN
INTRAMUSCULAR | Status: AC
  Filled 2013-05-11: qty 10

## 2013-05-11 MED ORDER — OXYCODONE HCL 5 MG PO TABS
5.0000 mg | ORAL_TABLET | ORAL | Status: DC | PRN
  Administered 2013-05-11 – 2013-05-13 (×10): 10 mg via ORAL
  Filled 2013-05-11 (×10): qty 2

## 2013-05-11 MED ORDER — VANCOMYCIN HCL IN DEXTROSE 1-5 GM/200ML-% IV SOLN
1000.0000 mg | Freq: Two times a day (BID) | INTRAVENOUS | Status: AC
  Administered 2013-05-12: 1000 mg via INTRAVENOUS
  Filled 2013-05-11: qty 200

## 2013-05-11 MED ORDER — HYDROMORPHONE HCL PF 1 MG/ML IJ SOLN
INTRAMUSCULAR | Status: DC | PRN
  Administered 2013-05-11 (×4): .4 mg via INTRAVENOUS

## 2013-05-11 MED ORDER — LACTATED RINGERS IV SOLN
INTRAVENOUS | Status: DC
  Administered 2013-05-11: 16:00:00 via INTRAVENOUS
  Administered 2013-05-11: 1000 mL via INTRAVENOUS

## 2013-05-11 MED ORDER — DEXAMETHASONE 6 MG PO TABS
10.0000 mg | ORAL_TABLET | Freq: Every day | ORAL | Status: AC
  Administered 2013-05-12: 10 mg via ORAL
  Filled 2013-05-11: qty 1

## 2013-05-11 MED ORDER — KETAMINE HCL 10 MG/ML IJ SOLN
INTRAMUSCULAR | Status: AC
  Filled 2013-05-11: qty 1

## 2013-05-11 MED ORDER — BUPIVACAINE LIPOSOME 1.3 % IJ SUSP
20.0000 mL | Freq: Once | INTRAMUSCULAR | Status: DC
  Filled 2013-05-11: qty 20

## 2013-05-11 MED ORDER — GABAPENTIN 300 MG PO CAPS
600.0000 mg | ORAL_CAPSULE | Freq: Three times a day (TID) | ORAL | Status: DC | PRN
  Filled 2013-05-11: qty 2

## 2013-05-11 MED ORDER — LACTATED RINGERS IV SOLN: INTRAVENOUS | Status: DC

## 2013-05-11 MED ORDER — PHENOL 1.4 % MT LIQD
1.0000 | OROMUCOSAL | Status: DC | PRN
  Filled 2013-05-11: qty 177

## 2013-05-11 MED ORDER — DEXAMETHASONE SODIUM PHOSPHATE 10 MG/ML IJ SOLN: 10.0000 mg | Freq: Once | INTRAMUSCULAR | Status: DC

## 2013-05-11 MED ORDER — HYDROMORPHONE HCL PF 1 MG/ML IJ SOLN
0.2500 mg | INTRAMUSCULAR | Status: DC | PRN
  Administered 2013-05-11 (×4): 0.5 mg via INTRAVENOUS

## 2013-05-11 MED ORDER — METOCLOPRAMIDE HCL 5 MG/ML IJ SOLN: 5.0000 mg | Freq: Three times a day (TID) | INTRAMUSCULAR | Status: DC | PRN

## 2013-05-11 MED ORDER — ACETAMINOPHEN 650 MG RE SUPP: 650.0000 mg | Freq: Four times a day (QID) | RECTAL | Status: DC | PRN

## 2013-05-11 MED ORDER — POTASSIUM CHLORIDE IN NACL 20-0.9 MEQ/L-% IV SOLN
INTRAVENOUS | Status: DC
  Administered 2013-05-11: 21:00:00 via INTRAVENOUS
  Filled 2013-05-11 (×3): qty 1000

## 2013-05-11 MED ORDER — CYCLOBENZAPRINE HCL 10 MG PO TABS
10.0000 mg | ORAL_TABLET | Freq: Three times a day (TID) | ORAL | Status: DC | PRN
  Administered 2013-05-12 – 2013-05-13 (×5): 10 mg via ORAL
  Filled 2013-05-11 (×5): qty 1

## 2013-05-11 MED ORDER — BUPROPION HCL ER (SR) 150 MG PO TB12
300.0000 mg | ORAL_TABLET | Freq: Every day | ORAL | Status: DC
  Administered 2013-05-12 – 2013-05-13 (×2): 300 mg via ORAL
  Filled 2013-05-11 (×2): qty 2

## 2013-05-11 MED ORDER — LORAZEPAM 0.5 MG PO TABS: 0.5000 mg | ORAL_TABLET | Freq: Three times a day (TID) | ORAL | Status: DC | PRN

## 2013-05-11 MED ORDER — SODIUM CHLORIDE 0.9 % IV SOLN
INTRAVENOUS | Status: DC
  Administered 2013-05-11: 14:00:00 via INTRAVENOUS

## 2013-05-11 MED ORDER — SUFENTANIL CITRATE 50 MCG/ML IV SOLN
INTRAVENOUS | Status: AC
  Filled 2013-05-11: qty 1

## 2013-05-11 MED ORDER — DEXTROSE 5 % IV SOLN
500.0000 mg | Freq: Four times a day (QID) | INTRAVENOUS | Status: DC | PRN
Start: 2013-05-11 — End: 2013-05-13
  Administered 2013-05-11: 500 mg via INTRAVENOUS
  Filled 2013-05-11: qty 5

## 2013-05-11 MED ORDER — PHENYLEPHRINE HCL 10 MG/ML IJ SOLN
INTRAMUSCULAR | Status: DC | PRN
  Administered 2013-05-11: 40 ug via INTRAVENOUS

## 2013-05-11 MED ORDER — ONDANSETRON HCL 4 MG/2ML IJ SOLN: 4.0000 mg | Freq: Four times a day (QID) | INTRAMUSCULAR | Status: DC | PRN

## 2013-05-11 MED ORDER — BUPIVACAINE LIPOSOME 1.3 % IJ SUSP
INTRAMUSCULAR | Status: DC | PRN
  Administered 2013-05-11: 20 mL

## 2013-05-11 MED ORDER — RIVAROXABAN 10 MG PO TABS
10.0000 mg | ORAL_TABLET | Freq: Every day | ORAL | Status: DC
  Administered 2013-05-12 – 2013-05-13 (×2): 10 mg via ORAL
  Filled 2013-05-11 (×3): qty 1

## 2013-05-11 MED ORDER — LIDOCAINE HCL (CARDIAC) 20 MG/ML IV SOLN
INTRAVENOUS | Status: AC
  Filled 2013-05-11: qty 5

## 2013-05-11 MED ORDER — CEFAZOLIN SODIUM-DEXTROSE 2-3 GM-% IV SOLR: 2.0000 g | INTRAVENOUS | Status: DC

## 2013-05-11 MED ORDER — SUCCINYLCHOLINE CHLORIDE 20 MG/ML IJ SOLN
INTRAMUSCULAR | Status: DC | PRN
  Administered 2013-05-11: 100 mg via INTRAVENOUS

## 2013-05-11 SURGICAL SUPPLY — 47 items
BAG SPEC THK2 15X12 ZIP CLS (MISCELLANEOUS) ×1
BAG ZIPLOCK 12X15 (MISCELLANEOUS) ×1 IMPLANT
BLADE SAW SGTL 18X1.27X75 (BLADE) ×2 IMPLANT
CAPT HIP PF COP ×1 IMPLANT
DECANTER SPIKE VIAL GLASS SM (MISCELLANEOUS) ×2 IMPLANT
DRAPE C-ARM 42X120 X-RAY (DRAPES) ×2 IMPLANT
DRAPE STERI IOBAN 125X83 (DRAPES) ×2 IMPLANT
DRAPE U-SHAPE 47X51 STRL (DRAPES) ×6 IMPLANT
DRSG ADAPTIC 3X8 NADH LF (GAUZE/BANDAGES/DRESSINGS) ×2 IMPLANT
DRSG MEPILEX BORDER 4X4 (GAUZE/BANDAGES/DRESSINGS) ×2 IMPLANT
DRSG MEPILEX BORDER 4X8 (GAUZE/BANDAGES/DRESSINGS) ×2 IMPLANT
DURAPREP 26ML APPLICATOR (WOUND CARE) ×2 IMPLANT
ELECT BLADE 6.5 EXT (BLADE) ×2 IMPLANT
ELECT REM PT RETURN 9FT ADLT (ELECTROSURGICAL) ×2
ELECTRODE REM PT RTRN 9FT ADLT (ELECTROSURGICAL) ×1 IMPLANT
EVACUATOR 1/8 PVC DRAIN (DRAIN) ×2 IMPLANT
FACESHIELD WRAPAROUND (MASK) ×8 IMPLANT
FACESHIELD WRAPAROUND OR TEAM (MASK) ×4 IMPLANT
GLOVE BIO SURGEON STRL SZ7.5 (GLOVE) ×2 IMPLANT
GLOVE BIO SURGEON STRL SZ8 (GLOVE) ×6 IMPLANT
GLOVE BIOGEL PI IND STRL 7.0 (GLOVE) IMPLANT
GLOVE BIOGEL PI IND STRL 8 (GLOVE) ×2 IMPLANT
GLOVE BIOGEL PI IND STRL 8.5 (GLOVE) IMPLANT
GLOVE BIOGEL PI INDICATOR 7.0 (GLOVE) ×1
GLOVE BIOGEL PI INDICATOR 8 (GLOVE) ×3
GLOVE BIOGEL PI INDICATOR 8.5 (GLOVE) ×1
GOWN STRL REUS TWL 2XL XL LVL4 (GOWN DISPOSABLE) ×1 IMPLANT
GOWN STRL REUS W/TWL LRG LVL3 (GOWN DISPOSABLE) ×2 IMPLANT
GOWN STRL REUS W/TWL XL LVL3 (GOWN DISPOSABLE) ×3 IMPLANT
KIT BASIN OR (CUSTOM PROCEDURE TRAY) ×2 IMPLANT
NDL SAFETY ECLIPSE 18X1.5 (NEEDLE) ×2 IMPLANT
NEEDLE HYPO 18GX1.5 SHARP (NEEDLE) ×4
PACK TOTAL JOINT (CUSTOM PROCEDURE TRAY) ×2 IMPLANT
PADDING CAST COTTON 6X4 STRL (CAST SUPPLIES) ×2 IMPLANT
SPONGE GAUZE 4X4 12PLY (GAUZE/BANDAGES/DRESSINGS) ×1 IMPLANT
STRIP CLOSURE SKIN 1/2X4 (GAUZE/BANDAGES/DRESSINGS) ×2 IMPLANT
SUCTION FRAZIER 12FR DISP (SUCTIONS) IMPLANT
SUT ETHIBOND NAB CT1 #1 30IN (SUTURE) ×2 IMPLANT
SUT MNCRL AB 4-0 PS2 18 (SUTURE) ×2 IMPLANT
SUT VIC AB 2-0 CT1 27 (SUTURE) ×4
SUT VIC AB 2-0 CT1 TAPERPNT 27 (SUTURE) ×2 IMPLANT
SUT VLOC 180 0 24IN GS25 (SUTURE) ×2 IMPLANT
SYR 20CC LL (SYRINGE) ×2 IMPLANT
SYR 50ML LL SCALE MARK (SYRINGE) ×2 IMPLANT
TOWEL OR 17X26 10 PK STRL BLUE (TOWEL DISPOSABLE) ×2 IMPLANT
TRAY FOLEY CATH 14FRSI W/METER (CATHETERS) ×1 IMPLANT
TRAY FOLEY CATH 16FRSI W/METER (SET/KITS/TRAYS/PACK) ×1 IMPLANT

## 2013-05-11 NOTE — Progress Notes (Signed)
C/o right great  toe pain, some swelling and redness noted  dr Wynelle Link notified  No new orders

## 2013-05-11 NOTE — Interval H&P Note (Signed)
History and Physical Interval Note:  05/11/2013 2:28 PM  Theresa Horton  has presented today for surgery, with the diagnosis of Osteoarthritis of the Left Hip  The various methods of treatment have been discussed with the patient and family. After consideration of risks, benefits and other options for treatment, the patient has consented to  Procedure(s): LEFT TOTAL HIP ARTHROPLASTY ANTERIOR APPROACH (Left) as a surgical intervention .  The patient's history has been reviewed, patient examined, no change in status, stable for surgery.  I have reviewed the patient's chart and labs.  Questions were answered to the patient's satisfaction.     Dione Plover Khy Pitre

## 2013-05-11 NOTE — Transfer of Care (Signed)
Immediate Anesthesia Transfer of Care Note  Patient: Theresa Horton  Procedure(s) Performed: Procedure(s): LEFT TOTAL HIP ARTHROPLASTY ANTERIOR APPROACH (Left)  Patient Location: PACU  Anesthesia Type:General  Level of Consciousness: awake and oriented  Airway & Oxygen Therapy: Patient Spontanous Breathing and Patient connected to face mask oxygen  Post-op Assessment: Report given to PACU RN and Post -op Vital signs reviewed and stable  Post vital signs: Reviewed  Complications: No apparent anesthesia complications

## 2013-05-11 NOTE — H&P (View-Only) (Signed)
Theresa Horton DOB: Apr 23, 1960 Single / Language: Cleophus Molt / Race: White Female  Date of Admission:  05-11-2013  Chief Complaint:  Left Hip Pain  History of Present Illness The patient is a 53 year old female who comes in for a preoperative History and Physical. The patient is scheduled for a left total hip arthroplasty (anterior approach) to be performed by Dr. Dione Plover. Aluisio, MD at [facility] on 05-11-2013. The patient is a 53 year old female who presents with a hip problem. The patient was seen for a second opinion.The patient reports left hip problems including pain and stiffness symptoms that have been present for 2 year(s) (and 1/2). The symptoms began without any known injury. Prior to being seen today the patient was previously evaluated by a colleague (Dr. Gildardo Cranker) 2 year(s) ago (1/2). Symptoms present at the patient's previous evaluation included pain in the hip. Previous workup for this problem has included hip MRI. Previous treatment for this problem has included corticosteroid injection (on 08/21/10). Note for "Hip problem": She states she did not get much relief from the IA hip injection. She has had multiple back surgeries. She has had surgery on both knees, (arthroscopic) The hip has been progressively getting worse over a several year time span. She said it is hurting all the time now. It is definitely limiting what she can and cannot do. She has lost a significant amount of motion in that hip. The steroid injection she had a few years ago did not help at all. She has been loosing weight in attempt to see if that will help. She has lost over 100 pounds in the past year and a half. She says that has helped some, but she recently gained about 14 pounds back and the pain is still significant. In addition to the pain, she has had significant functional deficits because of the hip. She is ready to get the hip fixed. They have been treated conservatively in the  past for the above stated problem and despite conservative measures, they continue to have progressive pain and severe functional limitations and dysfunction. They have failed non-operative management including home exercise, medications, and injections. It is felt that they would benefit from undergoing total joint replacement. Risks and benefits of the procedure have been discussed with the patient and they elect to proceed with surgery. There are no active contraindications to surgery such as ongoing infection or rapidly progressive neurological disease.   Allergies NSAIDs. swelling Terbinafine HCl *DERMATOLOGICALS*. constipation Penicillin VK *PENICILLINS*. hives Lipitor *ANTIHYPERLIPIDEMICS*. myalgias Sulfanilamide *CHEMICALS*. thrush   Problem List/Past Medical Hip osteoarthritis (715.95) History of left foot drop (V13.59) Rheumatoid Arthritis High blood pressure. Previously treated with RX meds but able to come off with weight loss Gastroesophageal Reflux Disease Hypercholesterolemia Diabetes Mellitus, Type II. Diet-controlled; Previously treated with RX meds but able to come off with weight loss Depression Anxiety Disorder Oophorectomy. right Sleep Apnea. Previously treated with CPAP but able to come off with weight loss Hemorrhoids Menopause TMJ Syndrome. Right Side   Family History First Degree Relatives. reported Chronic Obstructive Lung Disease. Mother. Heart disease in female family member before age 17 Heart Disease. Father. Cancer. Father. Kidney disease. Father. Rheumatoid Arthritis. Mother. Liver Disease, Chronic. Brother.   Social History Marital status. single Living situation. live alone Not under pain contract No history of drug/alcohol rehab Exercise. Exercises weekly; does other Children. 1 Current work status. disabled Current drinker. 01/28/2013: Currently drinks beer only occasionally per week Number of flights of  stairs before winded. 4-5 Tobacco use. Never smoker. 01/28/2013 Tobacco / smoke exposure. 01/28/2013: yes   Past Surgical History Carpal Tunnel Repair. bilateral Arthroscopy of Knee. bilateral Tonsillectomy. Date: 1966. Spinal Surgery. Date: 1987. 2012 Lumbar Surgery. Date: 2008. Patient states that she developed a left foot drop following this surgery. Arthroscopic Knee Surgery - Both. Patient states she developed staoh infections and cellulitis following knee scope procedures.   Review of Systems General:Present- Night Sweats. Not Present- Chills, Fever, Fatigue, Weight Gain, Weight Loss and Memory Loss. Skin:Not Present- Hives, Itching, Rash, Eczema and Lesions. HEENT:Not Present- Tinnitus, Headache, Double Vision, Visual Loss, Hearing Loss and Dentures. Respiratory:Not Present- Shortness of breath with exertion, Shortness of breath at rest, Allergies, Coughing up blood and Chronic Cough. Cardiovascular:Present- Palpitations. Not Present- Chest Pain, Racing/skipping heartbeats, Difficulty Breathing Lying Down, Murmur and Swelling. Gastrointestinal:Present- Constipation (chronic condition). Not Present- Bloody Stool, Heartburn, Abdominal Pain, Vomiting, Nausea, Diarrhea, Difficulty Swallowing, Jaundice and Loss of appetitie. Female Genitourinary:Not Present- Blood in Urine, Urinary frequency, Weak urinary stream, Discharge, Flank Pain, Incontinence, Painful Urination, Urgency, Urinary Retention and Urinating at Night. Musculoskeletal:Present- Joint Swelling, Joint Pain, Back Pain and Morning Stiffness. Not Present- Muscle Weakness, Muscle Pain and Spasms. Neurological:Not Present- Tremor, Dizziness, Blackout spells, Paralysis, Difficulty with balance and Weakness. Psychiatric:Not Present- Insomnia.    Vitals Weight: 228 lb Height: 66 in Weight was reported by patient. Height was reported by patient. Body Surface Area: 2.19 m Body Mass Index: 36.8  kg/m Pulse: 84 (Regular) Resp.: 20 (Unlabored) BP: 132/86 (Sitting, Right Arm, Standard)     Physical Exam The physical exam findings are as follows:   General Mental Status - Alert, cooperative and good historian. General Appearance- pleasant and Anxious. Not in acute distress. Orientation- Oriented X3. Build & Nutrition- Well nourished and Well developed.   Head and Neck Head- normocephalic, atraumatic . Neck Global Assessment- supple. no bruit auscultated on the right and no bruit auscultated on the left.   Eye Vision- Wears corrective lenses. Pupil- Bilateral- Regular and Round. Motion- Bilateral- EOMI.   Chest and Lung Exam Auscultation: Breath sounds:- clear at anterior chest wall and - clear at posterior chest wall. Adventitious sounds:- No Adventitious sounds.   Cardiovascular Auscultation:Rhythm- Regular rate and rhythm. Heart Sounds- S1 WNL and S2 WNL. Murmurs & Other Heart Sounds:Auscultation of the heart reveals - No Murmurs.   Abdomen Inspection:Contour- Generalized mild distention. Palpation/Percussion:Tenderness- Abdomen is non-tender to palpation. Rigidity (guarding)- Abdomen is soft. Auscultation:Auscultation of the abdomen reveals - Bowel sounds normal.   Female Genitourinary Not done, not pertinent to present illness  Musculoskeletal Her right hip has normal range of motion with no discomfort. Left his essentially fused. I can flex her to 90 degrees. She has no internal or external rotation and no abduction. She is noted to have some weakness with left foot dorsiflexion (preexisting foot drop - not complete) Her knee exam shows some crepitus with range of motion of both knees, but no tenderness or instability. Pulse, sensation, and motor intact. She has a significantly antalgic gait pattern  RADIOGRAPHS AP of pelvis and lateral of the left hip show that she has essentially no hip left on the left hand  side. She is completely bone on bone and has some erosion of the femoral head superiorly as well as erosion of some of the superior acetabulum. Her right hip looks normal.   Assessment & Plan Hip osteoarthritis (715.95) Impression: Left Hip  Note: Plan is for a Left Total Hip Replacement - Anterior Approach  by Dr. Wynelle Link.  Plan is to go home with mother.  PCP - Dr. Gar Ponto - Patient has been seen preoperatively and felt to be stable for surgery.  The patient does not have any contraindications and will receive TXA (tranexamic acid) prior to surgery.  Signed electronically by Joelene Millin, III PA-C

## 2013-05-11 NOTE — Op Note (Signed)
OPERATIVE REPORT  PREOPERATIVE DIAGNOSIS: Osteoarthritis of the Left hip.   POSTOPERATIVE DIAGNOSIS: Osteoarthritis of the Left  hip.   PROCEDURE: Left total hip arthroplasty, anterior approach.   SURGEON: Gaynelle Arabian, MD   ASSISTANT: Arlee Muslim, PA-C  ANESTHESIA:  General  ESTIMATED BLOOD LOSS:-300 ml  DRAINS: Hemovac x1.   COMPLICATIONS: None   CONDITION: PACU - hemodynamically stable.   BRIEF CLINICAL NOTE: Theresa Horton is a 53 y.o. female who has advanced end-  stage arthritis of his Left  hip with progressively worsening pain and  Dysfunction. 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 The patient has failed nonoperative management and presents for  total hip arthroplasty.   PROCEDURE IN DETAIL: After successful administration of spinal  anesthetic, the traction boots for the [facility] bed were placed on both  feet and the patient was placed onto the [employer] bed, boots placed into the leg  holders. The Left hip was then isolated from the perineum with plastic  drapes and prepped and draped in the usual sterile fashion. ASIS and  greater trochanter were marked and a oblique incision was made, starting  at about 1 cm lateral and 2 cm distal to the ASIS and coursing towards  the anterior cortex of the femur. The skin was cut with a 10 blade  through subcutaneous tissue to the level of the fascia overlying the  tensor fascia lata muscle. The fascia was then incised in line with the  incision at the junction of the anterior third and posterior 2/3rd. The  muscle was teased off the fascia and then the interval between the TFL  and the rectus was developed. The Hohmann retractor was then placed at  the top of the femoral neck over the capsule. The vessels overlying the  capsule were cauterized and the fat on top of the capsule was removed.  A Hohmann retractor was then placed anterior  underneath the rectus  femoris to give exposure to the entire anterior capsule. A T-shaped  capsulotomy was performed. The edges were tagged and the femoral head  was identified.       Osteophytes are removed off the superior acetabulum.  The femoral neck was then cut in situ with an oscillating saw. Traction  was then applied to the left lower extremity utilizing the [facility]  traction. The femoral head was then removed. Retractors were placed  around the acetabulum and then circumferential removal of the labrum was  performed. Osteophytes were also removed. Reaming starts at 45 mm to  medialize and  Increased in 2 mm increments to 49 mm. We reamed in  approximately 40 degrees of abduction, 20 degrees anteversion. A 50 mm  pinnacle acetabular shell was then impacted in anatomic position under  fluoroscopic guidance with excellent purchase. We did not need to place  any additional dome screws. A 32 mm neutral + 4 marathon liner was then  placed into the acetabular shell.       The femoral lift was then placed along the lateral aspect of the femur  just distal to the vastus ridge. The leg was  externally rotated and capsule  was stripped off the inferior aspect of the femoral neck down to the  level of the lesser trochanter, this was done with electrocautery. The femur was lifted after this was performed. The  leg was then placed and extended  in adducted position to essentially delivering the femur. We also removed the capsule superiorly and the  piriformis from the piriformis fossa to gain excellent exposure of the  proximal femur. Rongeur was used to remove some cancellous bone to get  into the lateral portion of the proximal femur for placement of the  initial starter reamer. The starter broaches was placed  the starter broach  and was shown to go down the center of the canal. Broaching  with the  Corail system was then performed starting at the canal finder, coursing  Up to size 8. A size 8  had excellent torsional and rotational  and axial stability. The trial standard offset neck was then placed  with a 32 + 5 trial head. The hip was then reduced. We confirmed that  the stem was in the canal both on AP and lateral x-rays. It also has excellent sizing. The hip was reduced with outstanding stability through full extension, full external rotation,  and then flexion in adduction internal rotation. AP pelvis was taken  and the leg lengths were measured and found to be exactly equal. Hip  was then dislocated again and the femoral head and neck removed. The  femoral broach was removed. Size 8 Corail stem with a standard offset  neck was then impacted into the femur following native anteversion. Has  excellent purchase in the canal. Excellent torsional and rotational and  axial stability. It is confirmed to be in the canal on AP and lateral  fluoroscopic views. The 32 + 5 ceramic head was placed and the hip  reduced with outstanding stability. Again AP pelvis was taken and it  confirmed that the leg lengths were equal. The wound was then copiously  irrigated with saline solution and the capsule reattached and repaired  with Ethibond suture.  20 mL of Exparel mixed with 50 mL of saline then additional 20 ml of .25% Bupivicaine injected into the capsule and into the edge of the tensor fascia lata as well as subcutaneous tissue. The fascia overlying the tensor fascia lata was  then closed with a running #1 V-Loc. Subcu was closed with interrupted  2-0 Vicryl and subcuticular running 4-0 Monocryl. Incision was cleaned  and dried. Steri-Strips and a bulky sterile dressing applied. Hemovac  drain was hooked to suction and then he was awakened and transported to  recovery in stable condition.        Please note that a surgical assistant was a medical necessity for this procedure to perform it in a safe and expeditious manner. Assistant was necessary to provide appropriate retraction of vital  neurovascular structures and to prevent femoral fracture and allow for anatomic placement of the prosthesis.  Gaynelle Arabian, M.D.

## 2013-05-11 NOTE — Anesthesia Postprocedure Evaluation (Signed)
  Anesthesia Post-op Note  Patient: Theresa Horton  Procedure(s) Performed: Procedure(s) (LRB): LEFT TOTAL HIP ARTHROPLASTY ANTERIOR APPROACH (Left)  Patient Location: PACU  Anesthesia Type: General  Level of Consciousness: awake and alert   Airway and Oxygen Therapy: Patient Spontanous Breathing  Post-op Pain: mild  Post-op Assessment: Post-op Vital signs reviewed, Patient's Cardiovascular Status Stable, Respiratory Function Stable, Patent Airway and No signs of Nausea or vomiting  Last Vitals:  Filed Vitals:   05/11/13 1756  BP: 154/88  Pulse: 83  Temp: 36.8 C  Resp: 16    Post-op Vital Signs: stable   Complications: No apparent anesthesia complications

## 2013-05-11 NOTE — Anesthesia Preprocedure Evaluation (Addendum)
Anesthesia Evaluation  Patient identified by MRN, date of birth, ID band Patient awake    Reviewed: Allergy & Precautions, H&P , NPO status , Patient's Chart, lab work & pertinent test results  History of Anesthesia Complications (+) PONV  Airway Mallampati: III TM Distance: >3 FB Neck ROM: full    Dental no notable dental hx.    Pulmonary sleep apnea ,  History OSA but no longer uses CPAP breath sounds clear to auscultation  Pulmonary exam normal       Cardiovascular hypertension, Rhythm:regular Rate:Normal  palpitations   Neuro/Psych Anxiety Depression Back surgery negative neurological ROS  negative psych ROS   GI/Hepatic negative GI ROS, Neg liver ROS, GERD-  Medicated and Controlled,  Endo/Other  diabetes, Well Controlled, Type 2Morbid obesityDiet controlled DM  Renal/GU negative Renal ROS  negative genitourinary   Musculoskeletal   Abdominal (+) + obese,   Peds  Hematology negative hematology ROS (+)   Anesthesia Other Findings   Reproductive/Obstetrics negative OB ROS                         Anesthesia Physical Anesthesia Plan  ASA: III  Anesthesia Plan: General   Post-op Pain Management:    Induction:   Airway Management Planned: Oral ETT  Additional Equipment:   Intra-op Plan:   Post-operative Plan: Extubation in OR  Informed Consent: I have reviewed the patients History and Physical, chart, labs and discussed the procedure including the risks, benefits and alternatives for the proposed anesthesia with the patient or authorized representative who has indicated his/her understanding and acceptance.   Dental Advisory Given  Plan Discussed with: CRNA and Surgeon  Anesthesia Plan Comments:        Anesthesia Quick Evaluation

## 2013-05-12 ENCOUNTER — Encounter (HOSPITAL_COMMUNITY): Payer: Self-pay | Admitting: Orthopedic Surgery

## 2013-05-12 LAB — CBC
HCT: 33.4 % — ABNORMAL LOW (ref 36.0–46.0)
Hemoglobin: 10.8 g/dL — ABNORMAL LOW (ref 12.0–15.0)
MCH: 28.1 pg (ref 26.0–34.0)
MCHC: 32.3 g/dL (ref 30.0–36.0)
MCV: 87 fL (ref 78.0–100.0)
PLATELETS: 391 10*3/uL (ref 150–400)
RBC: 3.84 MIL/uL — ABNORMAL LOW (ref 3.87–5.11)
RDW: 13.9 % (ref 11.5–15.5)
WBC: 9 10*3/uL (ref 4.0–10.5)

## 2013-05-12 LAB — BASIC METABOLIC PANEL
BUN: 10 mg/dL (ref 6–23)
CALCIUM: 8.6 mg/dL (ref 8.4–10.5)
CO2: 24 mEq/L (ref 19–32)
CREATININE: 0.74 mg/dL (ref 0.50–1.10)
Chloride: 103 mEq/L (ref 96–112)
GFR calc Af Amer: >90 mL/min (ref >90)
GFR calc non Af Amer: >90 mL/min (ref >90)
Glucose, Bld: 136 mg/dL — ABNORMAL HIGH (ref 70–99)
Potassium: 4.6 mEq/L (ref 3.7–5.3)
Sodium: 138 mEq/L (ref 137–147)

## 2013-05-12 MED ORDER — OMEPRAZOLE 20 MG PO CPDR
20.0000 mg | DELAYED_RELEASE_CAPSULE | Freq: Every day | ORAL | Status: DC
  Administered 2013-05-12 – 2013-05-13 (×2): 20 mg via ORAL
  Filled 2013-05-12 (×2): qty 1

## 2013-05-12 MED ORDER — NON FORMULARY: 20.0000 mg | Freq: Every day | Status: DC

## 2013-05-12 NOTE — Progress Notes (Signed)
Utilization review completed.  

## 2013-05-12 NOTE — Evaluation (Signed)
Occupational Therapy Evaluation Patient Details Name: Theresa Horton MRN: [id number] DOB: 1960-06-19 Today's Date: 06/10/2013    History of Present Illness LEFT TOTAL HIP ARTHROPLASTY ANTERIOR APPROACH (Left)   Clinical Impression   Pt presents to OT with decreased I with ADL activity s/p THA. Pt will benefit from skilled OT to increase I with ADL activity to return to PLOF    Follow Up Recommendations  Home health OT    Equipment Recommendations  None recommended by OT       Precautions / Restrictions Precautions Precautions: Anterior Hip      Mobility Bed Mobility Overal bed mobility: Needs Assistance Bed Mobility: Supine to Sit     Supine to sit: Mod assist        Transfers Overall transfer level: Needs assistance Equipment used: Rolling walker (2 wheeled) Transfers: Sit to/from [facility] Sit to Stand: Min assist Stand pivot transfers: Min assist                 ADL Overall ADL's : Needs assistance/impaired             Lower Body Bathing: Moderate assistance;Sit to/from stand       Lower Body Dressing: Moderate assistance;Sit to/from stand   Toilet Transfer: Minimal assistance;BSC   Toileting- Clothing Manipulation and Hygiene: Minimal assistance;Sit to/from stand       Functional mobility during ADLs: Minimal assistance General ADL Comments: very cues for safety and walker safety            Extremity/Trunk Assessment Upper Extremity Assessment Upper Extremity Assessment: Generalized weakness           Communication Communication Communication: No difficulties   Cognition Arousal/Alertness: Awake/alert Behavior During Therapy: WFL for tasks assessed/performed Overall Cognitive Status: Within Functional Limits for tasks assessed                                Home Living Family/patient expects to be discharged to:: Private residence Living Arrangements: Alone Available Help at Discharge:  Family Type of Home: House Home Access: Stairs to enter Technical brewer of Steps: 1 Entrance Stairs-Rails: None Home Layout: One level     Bathroom Shower/Tub: Teacher, early years/pre: Standard     Home Equipment: Bedside commode;Walker - standard          Prior Functioning/Environment Level of Independence: Independent             OT Diagnosis: Generalized weakness;Acute pain   OT Problem List: Decreased strength;Decreased activity tolerance;Pain   OT Treatment/Interventions: Self-care/ADL training;Patient/family education;DME and/or AE instruction    OT Goals(Current goals can be found in the care plan section) Acute Rehab OT Goals Patient Stated Goal: back home and I OT Goal Formulation: With patient Time For Goal Achievement: 05/26/13 Potential to Achieve Goals: Good  OT Frequency: Min 2X/week    End of Session Nurse Communication: Mobility status  Activity Tolerance: Patient tolerated treatment well Patient left: in chair   Time: 5638-7564 OT Time Calculation (min): 27 min Charges:  OT General Charges $OT Visit: 1 Procedure OT Evaluation $Initial OT Evaluation Tier I: 1 Procedure OT Treatments $Self Care/Home Management : 23-37 mins G-Codes:    Betsy Pries 2013-06-10, 10:43 AM

## 2013-05-12 NOTE — Progress Notes (Signed)
Physical Therapy Treatment Patient Details Name: Theresa Horton MRN: [id number] DOB: [birth year] Today's Date: 2013-05-25    History of Present Illness LEFT TOTAL HIP ARTHROPLASTY ANTERIOR APPROACH (Left)    PT Comments    progressing  Follow Up Recommendations  Home health PT     Equipment Recommendations  Rolling walker with 5" wheels    Recommendations for Other Services       Precautions / Restrictions Precautions Precautions: None Restrictions Other Position/Activity Restrictions: WBAT    Mobility  Bed Mobility Overal bed mobility: Needs Assistance Bed Mobility: Supine to Sit;Sit to Supine     Supine to sit: Min assist Sit to supine: Min assist   General bed mobility comments: cues for technique  Transfers Overall transfer level: Needs assistance Equipment used: Rolling walker (2 wheeled) Transfers: Sit to/from Stand Sit to Stand: Min assist Stand pivot transfers: Min guard       General transfer comment: cues for hand placement and LLE position  Ambulation/Gait Ambulation/Gait assistance: Min assist;Min guard Ambulation Distance (Feet): 80 Feet Assistive device: Rolling walker (2 wheeled) Gait Pattern/deviations: Step-to pattern Gait velocity: decr   General Gait Details: cues for sequence and RW safety; vaulting on right LE  to advance LLE   Stairs            Wheelchair Mobility    Modified Rankin (Stroke Patients Only)       Balance                                    Cognition Arousal/Alertness: Awake/alert Behavior During Therapy: WFL for tasks assessed/performed Overall Cognitive Status: Within Functional Limits for tasks assessed                      Exercises Total Joint Exercises Ankle Circles/Pumps: AROM;Both;10 reps Quad Sets: 10 reps;AROM    General Comments        Pertinent Vitals/Pain C/o muscle cramps left hip    Home Living Family/patient expects to be discharged to:: Private  residence Living Arrangements: Alone                  Prior Function            PT Goals (current goals can now be found in the care plan section) Acute Rehab PT Goals Patient Stated Goal: back home and I PT Goal Formulation: With patient Time For Goal Achievement: 05/17/13 Potential to Achieve Goals: Good Progress towards PT goals: Progressing toward goals    Frequency  7X/week    PT Plan Current plan remains appropriate    Co-evaluation             End of Session   Activity Tolerance: Patient tolerated treatment well Patient left: in bed;with call bell/phone within reach     Time: 1532-1559 PT Time Calculation (min): 27 min  Charges:  $Gait Training: 23-37 mins                    G Codes:      Neil Crouch 2013-05-25, 4:22 PM

## 2013-05-12 NOTE — Evaluation (Signed)
Physical Therapy Evaluation Patient Details Name: Theresa Horton MRN: [id number] DOB: February 14, 1960 Today's Date: 05/12/2013   History of Present Illness  LEFT TOTAL HIP ARTHROPLASTY ANTERIOR APPROACH (Left)  Clinical Impression  Will benefit form PT in acute setting to address deficits below    Follow Up Recommendations Home health PT    Equipment Recommendations  Rolling walker with 5" wheels    Recommendations for Other Services       Precautions / Restrictions Precautions Precautions: None Restrictions Other Position/Activity Restrictions: WBAT      Mobility  Bed Mobility Overal bed mobility: Needs Assistance Bed Mobility: Sit to Supine     Supine to sit: Mod assist     General bed mobility comments: cues for technique  Transfers Overall transfer level: Needs assistance Equipment used: Rolling walker (2 wheeled) Transfers: Sit to/from [facility] Sit to Stand: Min assist Stand pivot transfers: Min assist       General transfer comment: cues for hand placement and LLE position  Ambulation/Gait Ambulation/Gait assistance: Min assist Ambulation Distance (Feet): 80 Feet Assistive device: Rolling walker (2 wheeled) Gait Pattern/deviations: Step-to pattern;Antalgic Gait velocity: decr   General Gait Details: cues for sequence and RW safety  Stairs            Wheelchair Mobility    Modified Rankin (Stroke Patients Only)       Balance                                             Pertinent Vitals/Pain Pain 7/10, premedicated    Home Living Family/patient expects to be discharged to:: Private residence Living Arrangements: Alone Available Help at Discharge: Family Type of Home: House Home Access: Stairs to enter Entrance Stairs-Rails: None Technical brewer of Steps: 1 Home Layout: One level Home Equipment: Bedside commode;Walker - standard Additional Comments: standard walker is is very old     Prior Function Level of Independence: Independent               Hand Dominance        Extremity/Trunk Assessment   Upper Extremity Assessment: Generalized weakness           Lower Extremity Assessment: LLE deficits/detail   LLE Deficits / Details: limited ankle df due to back surgery in past; hip flexion 2+/5     Communication   Communication: No difficulties  Cognition Arousal/Alertness: Awake/alert Behavior During Therapy: WFL for tasks assessed/performed Overall Cognitive Status: Within Functional Limits for tasks assessed                      General Comments      Exercises Total Joint Exercises Ankle Circles/Pumps: AROM;Both;10 reps Quad Sets: 10 reps;AROM      Assessment/Plan    PT Assessment Patient needs continued PT services  PT Diagnosis Difficulty walking   PT Problem List Decreased strength;Decreased activity tolerance;Decreased balance;Decreased mobility;Decreased knowledge of use of DME  PT Treatment Interventions DME instruction;Gait training;Functional mobility training;Therapeutic activities;Therapeutic exercise;Patient/family education;Stair training   PT Goals (Current goals can be found in the Care Plan section) Acute Rehab PT Goals Patient Stated Goal: back home and I PT Goal Formulation: With patient Time For Goal Achievement: 05/17/13 Potential to Achieve Goals: Good    Frequency 7X/week   Barriers to discharge        Co-evaluation  End of Session   Activity Tolerance: Patient tolerated treatment well Patient left: with call bell/phone within reach;in bed Nurse Communication: Mobility status         Time: 1130-1202 PT Time Calculation (min): 32 min   Charges:   PT Evaluation $Initial PT Evaluation Tier I: 1 Procedure PT Treatments $Gait Training: 23-37 mins   PT G Codes:          Neil Crouch 05/12/2013, 1:37 PM

## 2013-05-12 NOTE — Progress Notes (Signed)
   Subjective: 1 Day Post-Op Procedure(s) (LRB): LEFT TOTAL HIP ARTHROPLASTY ANTERIOR APPROACH (Left) Patient reports pain as mild.   Patient seen in rounds with Dr. Wynelle Link. Patient is well, but has had some minor complaints of pain in the hip, requiring pain medications We will start therapy today.  Plan is to go Home after hospital stay.  Objective: Vital signs in last 24 hours: Temp:  [98 F (36.7 C)-98.5 F (36.9 C)] 98.2 F (36.8 C) (04/16 0659) Pulse Rate:  [78-91] 91 (04/16 0659) Resp:  [13-18] 16 (04/16 0800) BP: (120-166)/(80-98) 166/96 mmHg (04/16 0659) SpO2:  [96 %-100 %] 96 % (04/16 0800) Weight:  [108.863 kg (240 lb)] 108.863 kg (240 lb) (04/16 0102)  Intake/Output from previous day:  Intake/Output Summary (Last 24 hours) at 05/12/13 0841 Last data filed at 05/12/13 0700  Gross per 24 hour  Intake 3113.75 ml  Output   3460 ml  Net -346.25 ml    Intake/Output this shift: UOP 2200 since MN -346  Labs:  Recent Labs  05/12/13 0428  HGB 10.8*    Recent Labs  05/12/13 0428  WBC 9.0  RBC 3.84*  HCT 33.4*  PLT 391    Recent Labs  05/12/13 0428  NA 138  K 4.6  CL 103  CO2 24  BUN 10  CREATININE 0.74  GLUCOSE 136*  CALCIUM 8.6   No results found for this basename: LABPT, INR,  in the last 72 hours  EXAM General - Patient is Alert, Appropriate and Oriented Extremity - Neurovascular intact Sensation intact distally Dressing - dressing C/D/I Motor Function - intact, moving foot and toes well on exam.  Hemovac pulled without difficulty.  Past Medical History  Diagnosis Date  . PONV (postoperative nausea and vomiting)   . Hypertension     hx of but had 100 lb weight loss   . Dysrhythmia     " sometimes heart flutters "   . Sleep apnea     hx of no longer has to use CPAP   . Diabetes mellitus without complication     hx of controlled with diet now   . Depression   . Anxiety   . GERD (gastroesophageal reflux disease)   . Arthritis     . Cancer     hxof precancerous cervix- removed   . Anemia     hx of years ago     Assessment/Plan: 1 Day Post-Op Procedure(s) (LRB): LEFT TOTAL HIP ARTHROPLASTY ANTERIOR APPROACH (Left) Principal Problem:   OA (osteoarthritis) of hip  Estimated body mass index is 38.76 kg/(m^2) as calculated from the following:   Height as of this encounter: 5\' 6"  (1.676 m).   Weight as of this encounter: 108.863 kg (240 lb). Advance diet Up with therapy Plan for discharge tomorrow Discharge home with home health  DVT Prophylaxis - Xarelto Weight Bearing As Tolerated left Leg Hemovac Pulled Begin Therapy   Viriginia Amendola 05/12/2013, 8:41 AM

## 2013-05-12 NOTE — Care Management Note (Signed)
    Page 1 of 2   05/12/2013     2:39:02 PM   CARE MANAGEMENT NOTE 05/12/2013  Patient:  Theresa Horton, Theresa Horton   Account Number:  [id number]  Date Initiated:  05/12/2013  Documentation initiated by:  Sunday Spillers  Subjective/Objective Assessment:   53 yo female admitted s/p left THA. PTA lived at home alone.     Action/Plan:   Home when stable, family to assist at d/c   Anticipated DC Date:  05/13/2013   Anticipated DC Plan:  [place]  CM consult      PAC Choice  [place]   Choice offered to / List presented to:  C-1 Patient      DME agency  [place] arranged  HH-2 PT      [facility] agency  [facility]   Status of service:  Completed, signed off Medicare Important Message given?  NA - LOS <3 / Initial given by admissions (If response is "NO", the following Medicare IM given date fields will be blank) Date Medicare IM given:   Date Additional Medicare IM given:    Discharge Disposition:  [place]  Per UR Regulation:  Reviewed for med. necessity/level of care/duration of stay  If discussed at [place] of Stay Meetings, dates discussed:    Comments:  05-12-13 Sunday Spillers RN CM 1400 Spoke with patient at bedside regarding [facility] PT. Patient has no preference of agencies. Contacted multiple agencies without success. [facility] can provide services with start of service 4/20 and [facility] can provide PT with a start of service 4/18 but they are out of network for [facility] and patient does not have the ability to pay the deductible and copays required. Contacted Alexzandrew Dara Lords, PA-C, he is ok with starting PT 4/20.

## 2013-05-13 ENCOUNTER — Encounter (HOSPITAL_COMMUNITY): Payer: Self-pay

## 2013-05-13 LAB — CBC
HCT: 30.7 % — ABNORMAL LOW (ref 36.0–46.0)
Hemoglobin: 10 g/dL — ABNORMAL LOW (ref 12.0–15.0)
MCH: 28.3 pg (ref 26.0–34.0)
MCHC: 32.6 g/dL (ref 30.0–36.0)
MCV: 87 fL (ref 78.0–100.0)
PLATELETS: 377 10*3/uL (ref 150–400)
RBC: 3.53 MIL/uL — ABNORMAL LOW (ref 3.87–5.11)
RDW: 14 % (ref 11.5–15.5)
WBC: 13.5 10*3/uL — AB (ref 4.0–10.5)

## 2013-05-13 LAB — BASIC METABOLIC PANEL
BUN: 15 mg/dL (ref 6–23)
CALCIUM: 8.7 mg/dL (ref 8.4–10.5)
CO2: 27 mEq/L (ref 19–32)
CREATININE: 0.78 mg/dL (ref 0.50–1.10)
Chloride: 105 mEq/L (ref 96–112)
Glucose, Bld: 131 mg/dL — ABNORMAL HIGH (ref 70–99)
Potassium: 4.4 mEq/L (ref 3.7–5.3)
Sodium: 140 mEq/L (ref 137–147)

## 2013-05-13 MED ORDER — CYCLOBENZAPRINE HCL 10 MG PO TABS: 10.0000 mg | ORAL_TABLET | Freq: Three times a day (TID) | ORAL | Status: DC | PRN

## 2013-05-13 MED ORDER — RIVAROXABAN 10 MG PO TABS: 10.0000 mg | ORAL_TABLET | Freq: Every day | ORAL | Status: DC

## 2013-05-13 MED ORDER — OXYCODONE HCL 5 MG PO TABS: 5.0000 mg | ORAL_TABLET | ORAL | Status: DC | PRN

## 2013-05-13 NOTE — Discharge Instructions (Addendum)
°Dr. Frank Aluisio °Total Joint Specialist °[facility] °[address] °[place], [place] [address] °[phone] ° ° ° °ANTERIOR APPROACH TOTAL HIP REPLACEMENT POSTOPERATIVE DIRECTIONS ° ° °Hip Rehabilitation, Guidelines Following Surgery  °The results of a hip operation are greatly improved after range of motion and muscle strengthening exercises. Follow all safety measures which are given to protect your hip. If any of these exercises cause increased pain or swelling in your joint, decrease the amount until you are comfortable again. Then slowly increase the exercises. Call your caregiver if you have problems or questions.  °HOME CARE INSTRUCTIONS  °Most of the following instructions are designed to prevent the dislocation of your new hip.  °Remove items at home which could result in a fall. This includes throw rugs or furniture in walking pathways.  °Continue medications as instructed at time of discharge. °· You may have some home medications which will be placed on hold until you complete the course of blood thinner medication. °· You may start showering once you are discharged home but do not submerge the incision under water. Just pat the incision dry and apply a dry gauze dressing on daily. °Do not put on socks or shoes without following the instructions of your caregivers.  °Sit on high chairs which makes it easier to stand.  °Sit on chairs with arms. Use the chair arms to help push yourself up when arising.  °Keep your leg on the side of the operation out in front of you when standing up.  °Arrange for the use of a toilet seat elevator so you are not sitting low.   °· Walk with walker as instructed.  °You may resume a sexual relationship in one month or when given the OK by your caregiver.  °Use walker as long as suggested by your caregivers.  °You may put full weight on your legs and walk as much as is comfortable. °Avoid periods of inactivity such as sitting longer than an hour  when not asleep. This helps prevent blood clots.  °You may return to work once you are cleared by your surgeon.  °Do not drive a car for 6 weeks or until released by your surgeon.  °Do not drive while taking narcotics.  °Wear elastic stockings for three weeks following surgery during the day but you may remove then at night.  °Make sure you keep all of your appointments after your operation with all of your doctors and caregivers. You should call the office at the above phone number and make an appointment for approximately two weeks after the date of your surgery. °Change the dressing daily and reapply a dry dressing each time. °Please pick up a stool softener and laxative for home use as long as you are requiring pain medications. °· Continue to use ice on the hip for pain and swelling from surgery. You may notice swelling that will progress down to the foot and ankle.  This is normal after  surgery.  Elevate the leg when you are not up walking on it.   °It is important for you to complete the blood thinner medication as prescribed by your doctor. °· Continue to use the breathing machine which will help keep your temperature down.  It is common for your temperature to cycle up and down following surgery, especially at night when you are not up moving around and exerting yourself.  The breathing machine keeps your lungs expanded and your temperature down. ° °RANGE OF MOTION AND STRENGTHENING EXERCISES  °  These exercises are designed to help you keep full movement of your hip joint. Follow your caregiver's or physical therapist's instructions. Perform all exercises about fifteen times, three times per day or as directed. Exercise both hips, even if you have had only one joint replacement. These exercises can be done on a training (exercise) mat, on the floor, on a table or on a bed. Use whatever works the best and is most comfortable for you. Use music or television while you are exercising so that the exercises are  a pleasant break in your day. This will make your life better with the exercises acting as a break in routine you can look forward to.  Lying on your back, slowly slide your foot toward your buttocks, raising your knee up off the floor. Then slowly slide your foot back down until your leg is straight again.  Lying on your back spread your legs as far apart as you can without causing discomfort.  Lying on your side, raise your upper leg and foot straight up from the floor as far as is comfortable. Slowly lower the leg and repeat.  Lying on your back, tighten up the muscle in the front of your thigh (quadriceps muscles). You can do this by keeping your leg straight and trying to raise your heel off the floor. This helps strengthen the largest muscle supporting your knee.  Lying on your back, tighten up the muscles of your buttocks both with the legs straight and with the knee bent at a comfortable angle while keeping your heel on the floor.   SKILLED REHAB INSTRUCTIONS: If the patient is transferred to a skilled rehab facility following release from the hospital, a list of the current medications will be sent to the facility for the patient to continue.  When discharged from the skilled rehab facility, please have the facility set up the patient's [place] prior to being released. Also, the skilled facility will be responsible for providing the patient with their medications at time of release from the facility to include their pain medication, the muscle relaxants, and their blood thinner medication. If the patient is still at the rehab facility at time of the two week follow up appointment, the skilled rehab facility will also need to assist the patient in arranging follow up appointment in our office and any transportation needs.  MAKE SURE YOU:  Understand these instructions.  Will watch your condition.  Will get help right away if you are not doing well or get worse.  Pick up  stool softner and laxative for home. Do not submerge incision under water. May shower. Continue to use ice for pain and swelling from surgery. Total Hip Protocol.  Take Xarelto for two and a half more weeks, then discontinue Xarelto. Once the patient has completed the Xarelto, they may resume the 81 mg Aspirin.   Information on my medicine - XARELTO (Rivaroxaban)  This medication education was reviewed with me or my healthcare representative as part of my discharge preparation.  The pharmacist that spoke with me during my hospital stay was:  Clovis Riley, [facility]  Why was Xarelto prescribed for you? Xarelto was prescribed for you to reduce the risk of blood clots forming after orthopedic surgery. The medical term for these abnormal blood clots is venous thromboembolism (VTE).  What do you need to know about xarelto ? Take your Xarelto ONCE DAILY at the same time every day. You may take it either with or without  food.  If you have difficulty swallowing the tablet whole, you may crush it and mix in applesauce just prior to taking your dose.  Take Xarelto exactly as prescribed by your doctor and DO NOT stop taking Xarelto without talking to the doctor who prescribed the medication.  Stopping without other VTE prevention medication to take the place of Xarelto may increase your risk of developing a clot.  After discharge, you should have regular check-up appointments with your healthcare provider that is prescribing your Xarelto.    What do you do if you miss a dose? If you miss a dose, take it as soon as you remember on the same day then continue your regularly scheduled once daily regimen the next day. Do not take two doses of Xarelto on the same day.   Important Safety Information A possible side effect of Xarelto is bleeding. You should call your healthcare provider right away if you experience any of the following:   Bleeding from an injury or your nose that does not  stop.   Unusual colored urine (red or dark brown) or unusual colored stools (red or black).   Unusual bruising for unknown reasons.   A serious fall or if you hit your head (even if there is no bleeding).  Some medicines may interact with Xarelto and might increase your risk of bleeding while on Xarelto. To help avoid this, consult your healthcare provider or pharmacist prior to using any new prescription or non-prescription medications, including herbals, vitamins, non-steroidal anti-inflammatory drugs (NSAIDs) and supplements.  This website has more information on Xarelto: [email or web address]

## 2013-05-13 NOTE — Progress Notes (Signed)
Physical Therapy Treatment Patient Details Name: Theresa Horton MRN: [id number] DOB: [birth year] Today's Date: 05/13/2013    History of Present Illness LEFT TOTAL HIP ARTHROPLASTY ANTERIOR APPROACH (Left)    PT Comments    Pt progressing; discussed mobility and stairs with pt and her mother; she feels comfortable with there ex and techniques, getting tired after OT and PT sessions this am   Follow Up Recommendations  Home health PT     Equipment Recommendations  None recommended by PT    Recommendations for Other Services       Precautions / Restrictions Precautions Precautions: None Restrictions Weight Bearing Restrictions: No Other Position/Activity Restrictions: WBAT    Mobility  Bed Mobility Overal bed mobility: Needs Assistance       Supine to sit: Supervision Sit to supine: Min guard   General bed mobility comments: OOB with OT  Transfers Overall transfer level: Needs assistance Equipment used: Rolling walker (2 wheeled) Transfers: Sit to/from Stand Sit to Stand: Supervision Stand pivot transfers: Supervision       General transfer comment: cues for hand placement and LLE position  Ambulation/Gait Ambulation/Gait assistance: Supervision;Min guard Ambulation Distance (Feet): 60 Feet Assistive device: Rolling walker (2 wheeled) Gait Pattern/deviations: Step-to pattern;Decreased step length - right;Decreased step length - left;Narrow base of support Gait velocity: decr   General Gait Details: cues for sequence and RW safety; vaulting on right LE  to advance LLE   Stairs Stairs: Yes Stairs assistance: Min assist Stair Management: Step to pattern;Backwards;With walker Number of Stairs: 1 (x 2) General stair comments: verbal cues for technique  Wheelchair Mobility    Modified Rankin (Stroke Patients Only)       Balance                                    Cognition Arousal/Alertness: Awake/alert Behavior During Therapy: WFL  for tasks assessed/performed Overall Cognitive Status: Within Functional Limits for tasks assessed                      Exercises Total Joint Exercises Ankle Circles/Pumps: AROM;Both;10 reps Short Arc Quad: AAROM;AROM;Left;15 reps Heel Slides: AAROM;Right;Left;15 reps Hip ABduction/ADduction: AAROM;Left;15 reps    General Comments        Pertinent Vitals/Pain 4/10 left hip; ice applied    Home Living                      Prior Function            PT Goals (current goals can now be found in the care plan section) Acute Rehab PT Goals Patient Stated Goal: back home and I PT Goal Formulation: With patient Time For Goal Achievement: 05/17/13 Potential to Achieve Goals: Good Progress towards PT goals: Progressing toward goals    Frequency  7X/week    PT Plan Current plan remains appropriate    Co-evaluation             End of Session Equipment Utilized During Treatment: Gait belt Activity Tolerance: Patient tolerated treatment well Patient left: in chair;with call bell/phone within reach;with family/visitor present     Time: 1002-1040 PT Time Calculation (min): 38 min  Charges:  $Gait Training: 23-37 mins $Therapeutic Exercise: 8-22 mins                    G Codes:      Neil Crouch  05/13/2013, 11:35 AM

## 2013-05-13 NOTE — Discharge Summary (Signed)
Physician Discharge Summary   Patient ID: Theresa Horton MRN: [id number] DOB/AGE: 54-Dec-1962 54 y.o.  Admit date: 05/11/2013 Discharge date: 05/13/2013  Primary Diagnosis:  Osteoarthritis of the Left hip.   Admission Diagnoses:  Past Medical History  Diagnosis Date  . PONV (postoperative nausea and vomiting)   . Hypertension     hx of but had 100 lb weight loss   . Dysrhythmia     " sometimes heart flutters "   . Sleep apnea     hx of no longer has to use CPAP   . Diabetes mellitus without complication     hx of controlled with diet now   . Depression   . Anxiety   . GERD (gastroesophageal reflux disease)   . Arthritis   . Cancer     hxof precancerous cervix- removed   . Anemia     hx of years ago    Discharge Diagnoses:   Principal Problem:   OA (osteoarthritis) of hip  Estimated body mass index is 38.76 kg/(m^2) as calculated from the following:   Height as of this encounter: 5' 6"  (1.676 m).   Weight as of this encounter: 108.863 kg (240 lb).  Procedure(s) (LRB): LEFT TOTAL HIP ARTHROPLASTY ANTERIOR APPROACH (Left)   Consults: None  HPI: Theresa Horton is a 53 y.o. female who has advanced end-  stage arthritis of his Left hip with progressively worsening pain and  Dysfunction. 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 The patient has failed nonoperative management and presents for  total hip arthroplasty.   Laboratory Data: Admission on 05/11/2013, Discharged on 05/13/2013  Component Date Value Ref Range Status  . Glucose-Capillary 05/11/2013 95  70 - 99 mg/dL Final  . Comment 1 05/11/2013 Documented in Chart   Final  . Glucose-Capillary 05/11/2013 105* 70 - 99 mg/dL Final  . Comment 1 05/11/2013 Documented in Chart   Final  . Comment 2 05/11/2013 Notify RN   Final  . WBC 05/12/2013 9.0  4.0 - 10.5 K/uL Final  . RBC 05/12/2013 3.84* 3.87 - 5.11 MIL/uL Final  . Hemoglobin  05/12/2013 10.8* 12.0 - 15.0 g/dL Final  . HCT 05/12/2013 33.4* 36.0 - 46.0 % Final  . MCV 05/12/2013 87.0  78.0 - 100.0 fL Final  . MCH 05/12/2013 28.1  26.0 - 34.0 pg Final  . MCHC 05/12/2013 32.3  30.0 - 36.0 g/dL Final  . RDW 05/12/2013 13.9  11.5 - 15.5 % Final  . Platelets 05/12/2013 391  150 - 400 K/uL Final  . Sodium 05/12/2013 138  137 - 147 mEq/L Final  . Potassium 05/12/2013 4.6  3.7 - 5.3 mEq/L Final  . Chloride 05/12/2013 103  96 - 112 mEq/L Final  . CO2 05/12/2013 24  19 - 32 mEq/L Final  . Glucose, Bld 05/12/2013 136* 70 - 99 mg/dL Final  . BUN 05/12/2013 10  6 - 23 mg/dL Final  . Creatinine, Ser 05/12/2013 0.74  0.50 - 1.10 mg/dL Final  . Calcium 05/12/2013 8.6  8.4 - 10.5 mg/dL Final  . GFR calc non Af Amer 05/12/2013 >90  >90 mL/min Final  . GFR calc Af Amer 05/12/2013 >90  >90 mL/min Final   Comment: (NOTE)                          The eGFR has been calculated using the CKD  EPI equation.                          This calculation has not been validated in all clinical situations.                          eGFR's persistently <90 mL/min signify possible Chronic Kidney                          Disease.  . WBC 05/13/2013 13.5* 4.0 - 10.5 K/uL Final  . RBC 05/13/2013 3.53* 3.87 - 5.11 MIL/uL Final  . Hemoglobin 05/13/2013 10.0* 12.0 - 15.0 g/dL Final  . HCT 05/13/2013 30.7* 36.0 - 46.0 % Final  . MCV 05/13/2013 87.0  78.0 - 100.0 fL Final  . MCH 05/13/2013 28.3  26.0 - 34.0 pg Final  . MCHC 05/13/2013 32.6  30.0 - 36.0 g/dL Final  . RDW 05/13/2013 14.0  11.5 - 15.5 % Final  . Platelets 05/13/2013 377  150 - 400 K/uL Final  . Sodium 05/13/2013 140  137 - 147 mEq/L Final  . Potassium 05/13/2013 4.4  3.7 - 5.3 mEq/L Final  . Chloride 05/13/2013 105  96 - 112 mEq/L Final  . CO2 05/13/2013 27  19 - 32 mEq/L Final  . Glucose, Bld 05/13/2013 131* 70 - 99 mg/dL Final  . BUN 05/13/2013 15  6 - 23 mg/dL Final  . Creatinine, Ser 05/13/2013 0.78  0.50 - 1.10 mg/dL Final  .  Calcium 05/13/2013 8.7  8.4 - 10.5 mg/dL Final  . GFR calc non Af Amer 05/13/2013 >90  >90 mL/min Final  . GFR calc Af Amer 05/13/2013 >90  >90 mL/min Final   Comment: (NOTE)                          The eGFR has been calculated using the CKD EPI equation.                          This calculation has not been validated in all clinical situations.                          eGFR's persistently <90 mL/min signify possible Chronic Kidney                          Disease.  Hospital Outpatient Visit on 05/03/2013  Component Date Value Ref Range Status  . aPTT 05/03/2013 29  24 - 37 seconds Final  . WBC 05/03/2013 8.1  4.0 - 10.5 K/uL Final  . RBC 05/03/2013 4.20  3.87 - 5.11 MIL/uL Final  . Hemoglobin 05/03/2013 11.8* 12.0 - 15.0 g/dL Final  . HCT 05/03/2013 36.6  36.0 - 46.0 % Final  . MCV 05/03/2013 87.1  78.0 - 100.0 fL Final  . MCH 05/03/2013 28.1  26.0 - 34.0 pg Final  . MCHC 05/03/2013 32.2  30.0 - 36.0 g/dL Final  . RDW 05/03/2013 13.9  11.5 - 15.5 % Final  . Platelets 05/03/2013 384  150 - 400 K/uL Final  . Sodium 05/03/2013 139  137 - 147 mEq/L Final  . Potassium 05/03/2013 3.9  3.7 - 5.3 mEq/L Final  . Chloride 05/03/2013 101  96 - 112 mEq/L Final  . CO2 05/03/2013 22  19 - 32 mEq/L Final  . Glucose, Bld 05/03/2013 158* 70 - 99 mg/dL Final  . BUN 05/03/2013 16  6 - 23 mg/dL Final  . Creatinine, Ser 05/03/2013 0.88  0.50 - 1.10 mg/dL Final  . Calcium 05/03/2013 9.1  8.4 - 10.5 mg/dL Final  . Total Protein 05/03/2013 6.8  6.0 - 8.3 g/dL Final  . Albumin 05/03/2013 3.2* 3.5 - 5.2 g/dL Final  . AST 05/03/2013 12  0 - 37 U/L Final  . ALT 05/03/2013 11  0 - 35 U/L Final  . Alkaline Phosphatase 05/03/2013 68  39 - 117 U/L Final  . Total Bilirubin 05/03/2013 <0.2* 0.3 - 1.2 mg/dL Final  . GFR calc non Af Amer 05/03/2013 74* >90 mL/min Final  . GFR calc Af Amer 05/03/2013 85* >90 mL/min Final   Comment: (NOTE)                          The eGFR has been calculated using the CKD EPI  equation.                          This calculation has not been validated in all clinical situations.                          eGFR's persistently <90 mL/min signify possible Chronic Kidney                          Disease.  Marland Kitchen Prothrombin Time 05/03/2013 12.8  11.6 - 15.2 seconds Final  . INR 05/03/2013 0.98  0.00 - 1.49 Final  . ABO/RH(D) 05/03/2013 A POS   Final  . Antibody Screen 05/03/2013 NEG   Final  . Sample Expiration 05/03/2013 05/14/2013   Final  . Color, Urine 05/03/2013 AMBER* YELLOW Final   BIOCHEMICALS MAY BE AFFECTED BY COLOR  . APPearance 05/03/2013 CLOUDY* CLEAR Final  . Specific Gravity, Urine 05/03/2013 1.036* 1.005 - 1.030 Final  . pH 05/03/2013 5.5  5.0 - 8.0 Final  . Glucose, UA 05/03/2013 NEGATIVE  NEGATIVE mg/dL Final  . Hgb urine dipstick 05/03/2013 NEGATIVE  NEGATIVE Final  . Bilirubin Urine 05/03/2013 SMALL* NEGATIVE Final  . Ketones, ur 05/03/2013 NEGATIVE  NEGATIVE mg/dL Final  . Protein, ur 05/03/2013 NEGATIVE  NEGATIVE mg/dL Final  . Urobilinogen, UA 05/03/2013 0.2  0.0 - 1.0 mg/dL Final  . Nitrite 05/03/2013 NEGATIVE  NEGATIVE Final  . Leukocytes, UA 05/03/2013 NEGATIVE  NEGATIVE Final   MICROSCOPIC NOT DONE ON URINES WITH NEGATIVE PROTEIN, BLOOD, LEUKOCYTES, NITRITE, OR GLUCOSE <1000 mg/dL.  Marland Kitchen MRSA, PCR 05/03/2013 NEGATIVE  NEGATIVE Final  . Staphylococcus aureus 05/03/2013 POSITIVE* NEGATIVE Final   Comment:                                 The Xpert SA Assay (FDA                          approved for NASAL specimens                          in patients over 12 years of age),  is one component of                          a comprehensive surveillance                          program.  Test performance has                          been validated by [facility] for patients greater                          than or equal to 72 year old.                          It is not intended                           to diagnose infection nor to                          guide or monitor treatment.  . Preg, Serum 05/03/2013 NEGATIVE  NEGATIVE Final   Comment:                                 THE SENSITIVITY OF THIS                          METHODOLOGY IS >10 mIU/mL.  . ABO/RH(D) 05/03/2013 A POS   Final     X-Rays:Dg Hip Complete Left  05/03/2013   CLINICAL DATA:  Left hip osteoarthritis.  EXAM: LEFT HIP - COMPLETE 2+ VIEW  COMPARISON:  None.  FINDINGS: Right hip joint appears normal. Degenerative changes are seen in the visualized lumbar spine. Severe sclerosis and deformity of left femoral head is noted with severe joint space narrowing most consistent with avascular necrosis and severe associated degenerative joint disease. No fracture or acute dislocation is noted.  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   Electronically Signed   By: Sabino Dick M.D.   On: 05/03/2013 12:43   Dg Pelvis Portable  05/11/2013   CLINICAL DATA:  Postop left hip arthroplasty  EXAM: PORTABLE PELVIS 1-2 VIEWS  COMPARISON:  None.  FINDINGS: Left total hip arthroplasty in satisfactory position on this single frontal view.  No fracture or dislocation is seen.  Visualized bony pelvis appears intact.  Degenerative changes of the visualized lumbar spine.  IMPRESSION: Left total hip arthroplasty in satisfactory position.   Electronically Signed   By: Julian Hy M.D.   On: 05/11/2013 17:14   Dg C-arm 1-60 Min-no Report  05/11/2013   CLINICAL DATA: left hip surgery   C-ARM 1-60 MINUTES  Fluoroscopy was utilized by the requesting physician.  No radiographic  interpretation.     EKG: Orders placed in visit on 05/03/13  . EKG 12-LEAD  . EKG 12-LEAD     Hospital Course: Patient was admitted to [facility] and taken to the  OR and underwent the above state procedure without complications.  Patient tolerated the procedure  well and was later transferred to the recovery room and then to the orthopaedic floor for postoperative care.  They were given PO and IV analgesics for pain control following their surgery.  They were given 24 hours of postoperative antibiotics of  Anti-infectives   Start     Dose/Rate Route Frequency Ordered Stop   05/12/13 0400  vancomycin (VANCOCIN) IVPB 1000 mg/200 mL premix     1,000 mg 200 mL/hr over 60 Minutes Intravenous Every 12 hours 05/11/13 1853 05/12/13 0528   05/11/13 1110  vancomycin (VANCOCIN) 1,500 mg in sodium chloride 0.9 % 500 mL IVPB     1,500 mg 250 mL/hr over 120 Minutes Intravenous On call to O.R. 05/11/13 1110 05/11/13 1613   05/11/13 1110  ceFAZolin (ANCEF) IVPB 2 g/50 mL premix  Status:  Discontinued     2 g 100 mL/hr over 30 Minutes Intravenous On call to O.R. 05/11/13 1110 05/11/13 1113     and started on DVT prophylaxis in the form of Xarelto.   PT and OT were ordered for total hip protocol.  The patient was allowed to be WBAT with therapy. Discharge planning was consulted to help with postop disposition and equipment needs.  Patient had a good night on the evening of surgery.  They started to get up OOB with therapy on day one.  Hemovac drain was pulled without difficulty.  Continued to work with therapy into day two.  Dressing was changed on day two and the incision was healing well.  Patient was seen in rounds and was ready to go home.  Discharge home with home health  Diet - Cardiac diet and Diabetic diet  Follow up - in 2 weeks  Activity - WBAT  Disposition - Home  Condition Upon Discharge - Good  D/C Meds - See DC Summary  DVT Prophylaxis - Xarelto       Discharge Orders   Future Orders Complete By Expires   Call MD / Call 911  As directed    Change dressing  As directed    Constipation Prevention  As directed    Diet - low sodium heart healthy  As directed    Diet general  As directed    Discharge instructions  As directed    Do not sit on low  chairs, stoools or toilet seats, as it may be difficult to get up from low surfaces  As directed    Driving restrictions  As directed    Increase activity slowly as tolerated  As directed    Lifting restrictions  As directed    Patient may shower  As directed    TED hose  As directed    Weight bearing as tolerated  As directed    Questions:     Laterality:     Extremity:         Medication List    STOP taking these medications       aspirin EC 81 MG tablet     Glucosamine HCl 1500 MG Tabs     HYDROcodone-acetaminophen 10-325 MG per tablet  Commonly known as:  NORCO     L-LYSINE PO     meloxicam 15 MG tablet  Commonly known as:  MOBIC     PROBIOTIC DAILY PO     VELIVET 0.1/0.125/0.15 -0.025 MG tablet  Generic drug:  desogestrel-ethinyl estradiol      TAKE these medications  buPROPion 150 MG 12 hr tablet  Commonly known as:  WELLBUTRIN SR  Take 300 mg by mouth daily.     cyclobenzaprine 10 MG tablet  Commonly known as:  FLEXERIL  Take 1 tablet (10 mg total) by mouth 3 (three) times daily as needed for muscle spasms.     gabapentin 600 MG tablet  Commonly known as:  NEURONTIN  Take 600 mg by mouth 3 (three) times daily as needed (Pain).     LORazepam 1 MG tablet  Commonly known as:  ATIVAN  Take 0.5 mg by mouth 3 (three) times daily as needed for anxiety. Patient takes 1 mg at nite for sleep     omeprazole 20 MG capsule  Commonly known as:  PRILOSEC  Take 20 mg by mouth daily as needed (Acid reflux).     oxyCODONE 5 MG immediate release tablet  Commonly known as:  Oxy IR/ROXICODONE  Take 1-2 tablets (5-10 mg total) by mouth every 3 (three) hours as needed for moderate pain, severe pain or breakthrough pain.     rivaroxaban 10 MG Tabs tablet  Commonly known as:  XARELTO  - Take 1 tablet (10 mg total) by mouth daily with breakfast. Take Xarelto for two and a half more weeks, then discontinue Xarelto.  - Once the patient has completed the Xarelto, they  may resume the 81 mg Aspirin.     sertraline 100 MG tablet  Commonly known as:  ZOLOFT  Take 100 mg by mouth every morning.     ZIMS MAX-FREEZE 3.7 % Gel  Generic drug:  Menthol (Topical Analgesic)  Apply 1 application topically daily as needed (Muscle Pain).       Follow-up Information   Follow up with Gearlean Alf, MD. Schedule an appointment as soon as possible for a visit on 05/24/2013. (Call [phone] Monday to make the appointment)    Specialty:  Orthopedic Surgery   Contact information:   [address] [place] [address] [phone]       Signed: Mickel Crow 06/06/2013, 7:50 AM

## 2013-05-13 NOTE — Progress Notes (Signed)
Occupational Therapy Treatment Patient Details Name: Theresa Horton MRN: [id number] DOB: 03-03-1960 Today's Date: 05/13/2013    History of present illness LEFT TOTAL HIP ARTHROPLASTY ANTERIOR APPROACH (Left)               Precautions / Restrictions Precautions Precautions: None       Mobility Bed Mobility Overal bed mobility: Needs Assistance Bed Mobility: Supine to Sit;Sit to Supine     Supine to sit: Supervision Sit to supine: Min guard      Transfers   Equipment used: Rolling walker (2 wheeled) Transfers: Sit to/from Stand Sit to Stand: Supervision Stand pivot transfers: Supervision       General transfer comment: cues for hand placement and LLE position    Balance                                   ADL               Lower Body Bathing: Min guard;Sit to/from stand       Lower Body Dressing: Min guard;Sit to/from stand   Toilet Transfer: Retail buyer and Hygiene: Supervision/safety;Sit to/from stand       Functional mobility during ADLs: Min guard General ADL Comments: very cues for safety and walker safety      Vision                                                       Patient Left in chair;with call bell/phone within reach   Nurse Communication Mobility status        Time: 0920-1000 OT Time Calculation (min): 40 min  Charges: OT General Charges $OT Visit: 1 Procedure OT Treatments $Self Care/Home Management : 38-52 mins  Betsy Pries 05/13/2013, 11:29 AM

## 2013-05-13 NOTE — Progress Notes (Signed)
   Subjective: 2 Days Post-Op Procedure(s) (LRB): LEFT TOTAL HIP ARTHROPLASTY ANTERIOR APPROACH (Left) Patient reports pain as mild.   Patient seen in rounds with Dr. Wynelle Link. Patient is well, and has had no acute complaints or problems Patient is ready to go home  Objective: Vital signs in last 24 hours: Temp:  [98.2 F (36.8 C)-98.8 F (37.1 C)] 98.4 F (36.9 C) (04/17 0635) Pulse Rate:  [76-85] 77 (04/17 0635) Resp:  [16] 16 (04/17 0800) BP: (115-149)/(68-87) 141/82 mmHg (04/17 0635) SpO2:  [96 %-98 %] 96 % (04/17 0635)  Intake/Output from previous day:  Intake/Output Summary (Last 24 hours) at 05/13/13 0902 Last data filed at 05/13/13 5784  Gross per 24 hour  Intake    600 ml  Output   2800 ml  Net  -2200 ml    Intake/Output this shift:    Labs:  Recent Labs  05/12/13 0428 05/13/13 0418  HGB 10.8* 10.0*    Recent Labs  05/12/13 0428 05/13/13 0418  WBC 9.0 13.5*  RBC 3.84* 3.53*  HCT 33.4* 30.7*  PLT 391 377    Recent Labs  05/12/13 0428 05/13/13 0418  NA 138 140  K 4.6 4.4  CL 103 105  CO2 24 27  BUN 10 15  CREATININE 0.74 0.78  GLUCOSE 136* 131*  CALCIUM 8.6 8.7   No results found for this basename: LABPT, INR,  in the last 72 hours  EXAM: General - Patient is Alert, Appropriate and Oriented Extremity - Neurovascular intact Sensation intact distally Dorsiflexion/Plantar flexion intact Incision - clean, dry, no drainage, healing Motor Function - intact, moving foot and toes well on exam.   Assessment/Plan: 2 Days Post-Op Procedure(s) (LRB): LEFT TOTAL HIP ARTHROPLASTY ANTERIOR APPROACH (Left) Procedure(s) (LRB): LEFT TOTAL HIP ARTHROPLASTY ANTERIOR APPROACH (Left) Past Medical History  Diagnosis Date  . PONV (postoperative nausea and vomiting)   . Hypertension     hx of but had 100 lb weight loss   . Dysrhythmia     " sometimes heart flutters "   . Sleep apnea     hx of no longer has to use CPAP   . Diabetes mellitus without  complication     hx of controlled with diet now   . Depression   . Anxiety   . GERD (gastroesophageal reflux disease)   . Arthritis   . Cancer     hxof precancerous cervix- removed   . Anemia     hx of years ago    Principal Problem:   OA (osteoarthritis) of hip  Estimated body mass index is 38.76 kg/(m^2) as calculated from the following:   Height as of this encounter: 5\' 6"  (1.676 m).   Weight as of this encounter: 108.863 kg (240 lb). Up with therapy Discharge home with home health Diet - Cardiac diet and Diabetic diet Follow up - in 2 weeks Activity - WBAT Disposition - Home Condition Upon Discharge - Good D/C Meds - See DC Summary DVT Prophylaxis - Xarelto  Cathrine Krizan 05/13/2013, 9:02 AM

## 2013-05-13 NOTE — Progress Notes (Signed)
RN reviewed discharge instructions with patient and family. All questions answered.   Paperwork and prescriptions given.   NT rolled patient down to family car.   

## 2014-12-05 DIAGNOSIS — F331 Major depressive disorder, recurrent, moderate: Secondary | ICD-10-CM | POA: Diagnosis not present

## 2014-12-05 DIAGNOSIS — I10 Essential (primary) hypertension: Secondary | ICD-10-CM | POA: Diagnosis not present

## 2014-12-05 DIAGNOSIS — D649 Anemia, unspecified: Secondary | ICD-10-CM | POA: Diagnosis not present

## 2014-12-05 DIAGNOSIS — E1165 Type 2 diabetes mellitus with hyperglycemia: Secondary | ICD-10-CM | POA: Diagnosis not present

## 2014-12-05 DIAGNOSIS — K210 Gastro-esophageal reflux disease with esophagitis: Secondary | ICD-10-CM | POA: Diagnosis not present

## 2014-12-05 DIAGNOSIS — E782 Mixed hyperlipidemia: Secondary | ICD-10-CM | POA: Diagnosis not present

## 2014-12-12 DIAGNOSIS — K219 Gastro-esophageal reflux disease without esophagitis: Secondary | ICD-10-CM | POA: Diagnosis not present

## 2014-12-12 DIAGNOSIS — I10 Essential (primary) hypertension: Secondary | ICD-10-CM | POA: Diagnosis not present

## 2014-12-12 DIAGNOSIS — E1165 Type 2 diabetes mellitus with hyperglycemia: Secondary | ICD-10-CM | POA: Diagnosis not present

## 2014-12-12 DIAGNOSIS — Z0001 Encounter for general adult medical examination with abnormal findings: Secondary | ICD-10-CM | POA: Diagnosis not present

## 2014-12-12 DIAGNOSIS — E6609 Other obesity due to excess calories: Secondary | ICD-10-CM | POA: Diagnosis not present

## 2014-12-12 DIAGNOSIS — E782 Mixed hyperlipidemia: Secondary | ICD-10-CM | POA: Diagnosis not present

## 2014-12-12 DIAGNOSIS — F331 Major depressive disorder, recurrent, moderate: Secondary | ICD-10-CM | POA: Diagnosis not present

## 2014-12-12 DIAGNOSIS — G4733 Obstructive sleep apnea (adult) (pediatric): Secondary | ICD-10-CM | POA: Diagnosis not present

## 2015-02-14 DIAGNOSIS — I10 Essential (primary) hypertension: Secondary | ICD-10-CM | POA: Diagnosis not present

## 2015-02-14 DIAGNOSIS — E6609 Other obesity due to excess calories: Secondary | ICD-10-CM | POA: Diagnosis not present

## 2015-02-14 DIAGNOSIS — G4733 Obstructive sleep apnea (adult) (pediatric): Secondary | ICD-10-CM | POA: Diagnosis not present

## 2015-02-14 DIAGNOSIS — K219 Gastro-esophageal reflux disease without esophagitis: Secondary | ICD-10-CM | POA: Diagnosis not present

## 2015-02-14 DIAGNOSIS — M545 Low back pain: Secondary | ICD-10-CM | POA: Diagnosis not present

## 2015-02-14 DIAGNOSIS — E782 Mixed hyperlipidemia: Secondary | ICD-10-CM | POA: Diagnosis not present

## 2015-02-14 DIAGNOSIS — F331 Major depressive disorder, recurrent, moderate: Secondary | ICD-10-CM | POA: Diagnosis not present

## 2015-03-30 DIAGNOSIS — Z01419 Encounter for gynecological examination (general) (routine) without abnormal findings: Secondary | ICD-10-CM | POA: Diagnosis not present

## 2015-03-30 DIAGNOSIS — N920 Excessive and frequent menstruation with regular cycle: Secondary | ICD-10-CM | POA: Diagnosis not present

## 2015-03-30 DIAGNOSIS — Z6841 Body Mass Index (BMI) 40.0 and over, adult: Secondary | ICD-10-CM | POA: Diagnosis not present

## 2015-04-09 DIAGNOSIS — N920 Excessive and frequent menstruation with regular cycle: Secondary | ICD-10-CM | POA: Diagnosis not present

## 2015-04-09 DIAGNOSIS — N951 Menopausal and female climacteric states: Secondary | ICD-10-CM | POA: Diagnosis not present

## 2015-04-09 DIAGNOSIS — N924 Excessive bleeding in the premenopausal period: Secondary | ICD-10-CM | POA: Diagnosis not present

## 2015-04-09 DIAGNOSIS — Z90721 Acquired absence of ovaries, unilateral: Secondary | ICD-10-CM | POA: Diagnosis not present

## 2015-06-07 DIAGNOSIS — R300 Dysuria: Secondary | ICD-10-CM | POA: Diagnosis not present

## 2015-06-07 DIAGNOSIS — E1165 Type 2 diabetes mellitus with hyperglycemia: Secondary | ICD-10-CM | POA: Diagnosis not present

## 2015-06-07 DIAGNOSIS — I10 Essential (primary) hypertension: Secondary | ICD-10-CM | POA: Diagnosis not present

## 2015-06-07 DIAGNOSIS — E782 Mixed hyperlipidemia: Secondary | ICD-10-CM | POA: Diagnosis not present

## 2015-06-07 DIAGNOSIS — K219 Gastro-esophageal reflux disease without esophagitis: Secondary | ICD-10-CM | POA: Diagnosis not present

## 2015-06-11 DIAGNOSIS — M545 Low back pain: Secondary | ICD-10-CM | POA: Diagnosis not present

## 2015-06-11 DIAGNOSIS — K219 Gastro-esophageal reflux disease without esophagitis: Secondary | ICD-10-CM | POA: Diagnosis not present

## 2015-06-11 DIAGNOSIS — Z1212 Encounter for screening for malignant neoplasm of rectum: Secondary | ICD-10-CM | POA: Diagnosis not present

## 2015-06-11 DIAGNOSIS — I10 Essential (primary) hypertension: Secondary | ICD-10-CM | POA: Diagnosis not present

## 2015-06-11 DIAGNOSIS — F331 Major depressive disorder, recurrent, moderate: Secondary | ICD-10-CM | POA: Diagnosis not present

## 2015-06-11 DIAGNOSIS — E782 Mixed hyperlipidemia: Secondary | ICD-10-CM | POA: Diagnosis not present

## 2015-06-11 DIAGNOSIS — G4733 Obstructive sleep apnea (adult) (pediatric): Secondary | ICD-10-CM | POA: Diagnosis not present

## 2015-06-11 DIAGNOSIS — E6609 Other obesity due to excess calories: Secondary | ICD-10-CM | POA: Diagnosis not present

## 2015-09-12 DIAGNOSIS — E782 Mixed hyperlipidemia: Secondary | ICD-10-CM | POA: Diagnosis not present

## 2015-09-12 DIAGNOSIS — I10 Essential (primary) hypertension: Secondary | ICD-10-CM | POA: Diagnosis not present

## 2015-09-12 DIAGNOSIS — E1165 Type 2 diabetes mellitus with hyperglycemia: Secondary | ICD-10-CM | POA: Diagnosis not present

## 2015-09-12 DIAGNOSIS — G4733 Obstructive sleep apnea (adult) (pediatric): Secondary | ICD-10-CM | POA: Diagnosis not present

## 2015-09-12 DIAGNOSIS — Z0001 Encounter for general adult medical examination with abnormal findings: Secondary | ICD-10-CM | POA: Diagnosis not present

## 2015-09-12 DIAGNOSIS — K219 Gastro-esophageal reflux disease without esophagitis: Secondary | ICD-10-CM | POA: Diagnosis not present

## 2015-09-17 DIAGNOSIS — K219 Gastro-esophageal reflux disease without esophagitis: Secondary | ICD-10-CM | POA: Diagnosis not present

## 2015-09-17 DIAGNOSIS — I10 Essential (primary) hypertension: Secondary | ICD-10-CM | POA: Diagnosis not present

## 2015-09-17 DIAGNOSIS — Z6841 Body Mass Index (BMI) 40.0 and over, adult: Secondary | ICD-10-CM | POA: Diagnosis not present

## 2015-09-17 DIAGNOSIS — E782 Mixed hyperlipidemia: Secondary | ICD-10-CM | POA: Diagnosis not present

## 2015-09-17 DIAGNOSIS — F331 Major depressive disorder, recurrent, moderate: Secondary | ICD-10-CM | POA: Diagnosis not present

## 2015-09-17 DIAGNOSIS — M545 Low back pain: Secondary | ICD-10-CM | POA: Diagnosis not present

## 2015-09-17 DIAGNOSIS — G4733 Obstructive sleep apnea (adult) (pediatric): Secondary | ICD-10-CM | POA: Diagnosis not present

## 2015-09-17 DIAGNOSIS — E6609 Other obesity due to excess calories: Secondary | ICD-10-CM | POA: Diagnosis not present

## 2015-10-15 DIAGNOSIS — Z1231 Encounter for screening mammogram for malignant neoplasm of breast: Secondary | ICD-10-CM | POA: Diagnosis not present

## 2015-12-11 DIAGNOSIS — E1165 Type 2 diabetes mellitus with hyperglycemia: Secondary | ICD-10-CM | POA: Diagnosis not present

## 2015-12-11 DIAGNOSIS — Z0001 Encounter for general adult medical examination with abnormal findings: Secondary | ICD-10-CM | POA: Diagnosis not present

## 2015-12-11 DIAGNOSIS — E6609 Other obesity due to excess calories: Secondary | ICD-10-CM | POA: Diagnosis not present

## 2015-12-11 DIAGNOSIS — I10 Essential (primary) hypertension: Secondary | ICD-10-CM | POA: Diagnosis not present

## 2015-12-11 DIAGNOSIS — M545 Low back pain: Secondary | ICD-10-CM | POA: Diagnosis not present

## 2015-12-11 DIAGNOSIS — K219 Gastro-esophageal reflux disease without esophagitis: Secondary | ICD-10-CM | POA: Diagnosis not present

## 2015-12-11 DIAGNOSIS — M1711 Unilateral primary osteoarthritis, right knee: Secondary | ICD-10-CM | POA: Diagnosis not present

## 2015-12-11 DIAGNOSIS — E782 Mixed hyperlipidemia: Secondary | ICD-10-CM | POA: Diagnosis not present

## 2015-12-13 DIAGNOSIS — Z1389 Encounter for screening for other disorder: Secondary | ICD-10-CM | POA: Diagnosis not present

## 2015-12-13 DIAGNOSIS — E782 Mixed hyperlipidemia: Secondary | ICD-10-CM | POA: Diagnosis not present

## 2015-12-13 DIAGNOSIS — Z0001 Encounter for general adult medical examination with abnormal findings: Secondary | ICD-10-CM | POA: Diagnosis not present

## 2015-12-13 DIAGNOSIS — G4733 Obstructive sleep apnea (adult) (pediatric): Secondary | ICD-10-CM | POA: Diagnosis not present

## 2015-12-13 DIAGNOSIS — E1165 Type 2 diabetes mellitus with hyperglycemia: Secondary | ICD-10-CM | POA: Diagnosis not present

## 2015-12-13 DIAGNOSIS — Z6841 Body Mass Index (BMI) 40.0 and over, adult: Secondary | ICD-10-CM | POA: Diagnosis not present

## 2015-12-13 DIAGNOSIS — F331 Major depressive disorder, recurrent, moderate: Secondary | ICD-10-CM | POA: Diagnosis not present

## 2015-12-13 DIAGNOSIS — M1711 Unilateral primary osteoarthritis, right knee: Secondary | ICD-10-CM | POA: Diagnosis not present

## 2015-12-13 DIAGNOSIS — E6609 Other obesity due to excess calories: Secondary | ICD-10-CM | POA: Diagnosis not present

## 2015-12-13 DIAGNOSIS — I10 Essential (primary) hypertension: Secondary | ICD-10-CM | POA: Diagnosis not present

## 2015-12-13 DIAGNOSIS — M545 Low back pain: Secondary | ICD-10-CM | POA: Diagnosis not present

## 2015-12-13 DIAGNOSIS — K219 Gastro-esophageal reflux disease without esophagitis: Secondary | ICD-10-CM | POA: Diagnosis not present

## 2016-02-25 DIAGNOSIS — I10 Essential (primary) hypertension: Secondary | ICD-10-CM | POA: Diagnosis not present

## 2016-02-25 DIAGNOSIS — E6609 Other obesity due to excess calories: Secondary | ICD-10-CM | POA: Diagnosis not present

## 2016-02-25 DIAGNOSIS — E782 Mixed hyperlipidemia: Secondary | ICD-10-CM | POA: Diagnosis not present

## 2016-02-25 DIAGNOSIS — Z6841 Body Mass Index (BMI) 40.0 and over, adult: Secondary | ICD-10-CM | POA: Diagnosis not present

## 2016-02-25 DIAGNOSIS — G4733 Obstructive sleep apnea (adult) (pediatric): Secondary | ICD-10-CM | POA: Diagnosis not present

## 2016-02-25 DIAGNOSIS — K219 Gastro-esophageal reflux disease without esophagitis: Secondary | ICD-10-CM | POA: Diagnosis not present

## 2016-02-25 DIAGNOSIS — M19011 Primary osteoarthritis, right shoulder: Secondary | ICD-10-CM | POA: Diagnosis not present

## 2016-02-25 DIAGNOSIS — F331 Major depressive disorder, recurrent, moderate: Secondary | ICD-10-CM | POA: Diagnosis not present

## 2016-05-07 DIAGNOSIS — G4733 Obstructive sleep apnea (adult) (pediatric): Secondary | ICD-10-CM | POA: Diagnosis not present

## 2016-05-07 DIAGNOSIS — K210 Gastro-esophageal reflux disease with esophagitis: Secondary | ICD-10-CM | POA: Diagnosis not present

## 2016-05-07 DIAGNOSIS — D649 Anemia, unspecified: Secondary | ICD-10-CM | POA: Diagnosis not present

## 2016-05-07 DIAGNOSIS — Z9189 Other specified personal risk factors, not elsewhere classified: Secondary | ICD-10-CM | POA: Diagnosis not present

## 2016-05-07 DIAGNOSIS — F331 Major depressive disorder, recurrent, moderate: Secondary | ICD-10-CM | POA: Diagnosis not present

## 2016-05-07 DIAGNOSIS — I10 Essential (primary) hypertension: Secondary | ICD-10-CM | POA: Diagnosis not present

## 2016-05-07 DIAGNOSIS — E1165 Type 2 diabetes mellitus with hyperglycemia: Secondary | ICD-10-CM | POA: Diagnosis not present

## 2016-05-07 DIAGNOSIS — E782 Mixed hyperlipidemia: Secondary | ICD-10-CM | POA: Diagnosis not present

## 2016-05-12 DIAGNOSIS — I10 Essential (primary) hypertension: Secondary | ICD-10-CM | POA: Diagnosis not present

## 2016-05-12 DIAGNOSIS — F331 Major depressive disorder, recurrent, moderate: Secondary | ICD-10-CM | POA: Diagnosis not present

## 2016-05-12 DIAGNOSIS — K219 Gastro-esophageal reflux disease without esophagitis: Secondary | ICD-10-CM | POA: Diagnosis not present

## 2016-05-12 DIAGNOSIS — M1711 Unilateral primary osteoarthritis, right knee: Secondary | ICD-10-CM | POA: Diagnosis not present

## 2016-05-12 DIAGNOSIS — E1165 Type 2 diabetes mellitus with hyperglycemia: Secondary | ICD-10-CM | POA: Diagnosis not present

## 2016-05-12 DIAGNOSIS — E6609 Other obesity due to excess calories: Secondary | ICD-10-CM | POA: Diagnosis not present

## 2016-05-12 DIAGNOSIS — E782 Mixed hyperlipidemia: Secondary | ICD-10-CM | POA: Diagnosis not present

## 2016-05-12 DIAGNOSIS — M545 Low back pain: Secondary | ICD-10-CM | POA: Diagnosis not present

## 2016-05-12 DIAGNOSIS — G4733 Obstructive sleep apnea (adult) (pediatric): Secondary | ICD-10-CM | POA: Diagnosis not present

## 2016-08-15 DIAGNOSIS — F331 Major depressive disorder, recurrent, moderate: Secondary | ICD-10-CM | POA: Diagnosis not present

## 2016-08-15 DIAGNOSIS — Z9189 Other specified personal risk factors, not elsewhere classified: Secondary | ICD-10-CM | POA: Diagnosis not present

## 2016-08-15 DIAGNOSIS — G4733 Obstructive sleep apnea (adult) (pediatric): Secondary | ICD-10-CM | POA: Diagnosis not present

## 2016-08-15 DIAGNOSIS — D649 Anemia, unspecified: Secondary | ICD-10-CM | POA: Diagnosis not present

## 2016-08-15 DIAGNOSIS — E782 Mixed hyperlipidemia: Secondary | ICD-10-CM | POA: Diagnosis not present

## 2016-08-15 DIAGNOSIS — E1165 Type 2 diabetes mellitus with hyperglycemia: Secondary | ICD-10-CM | POA: Diagnosis not present

## 2016-08-15 DIAGNOSIS — K210 Gastro-esophageal reflux disease with esophagitis: Secondary | ICD-10-CM | POA: Diagnosis not present

## 2016-09-11 DIAGNOSIS — I10 Essential (primary) hypertension: Secondary | ICD-10-CM | POA: Diagnosis not present

## 2016-09-11 DIAGNOSIS — K219 Gastro-esophageal reflux disease without esophagitis: Secondary | ICD-10-CM | POA: Diagnosis not present

## 2016-09-11 DIAGNOSIS — M19011 Primary osteoarthritis, right shoulder: Secondary | ICD-10-CM | POA: Diagnosis not present

## 2016-09-11 DIAGNOSIS — M545 Low back pain: Secondary | ICD-10-CM | POA: Diagnosis not present

## 2016-09-11 DIAGNOSIS — M1711 Unilateral primary osteoarthritis, right knee: Secondary | ICD-10-CM | POA: Diagnosis not present

## 2016-09-11 DIAGNOSIS — E1165 Type 2 diabetes mellitus with hyperglycemia: Secondary | ICD-10-CM | POA: Diagnosis not present

## 2016-09-11 DIAGNOSIS — E782 Mixed hyperlipidemia: Secondary | ICD-10-CM | POA: Diagnosis not present

## 2016-09-11 DIAGNOSIS — Z6841 Body Mass Index (BMI) 40.0 and over, adult: Secondary | ICD-10-CM | POA: Diagnosis not present

## 2016-11-20 DIAGNOSIS — M7521 Bicipital tendinitis, right shoulder: Secondary | ICD-10-CM | POA: Diagnosis not present

## 2016-11-20 DIAGNOSIS — Z6841 Body Mass Index (BMI) 40.0 and over, adult: Secondary | ICD-10-CM | POA: Diagnosis not present

## 2016-11-20 DIAGNOSIS — M7551 Bursitis of right shoulder: Secondary | ICD-10-CM | POA: Diagnosis not present

## 2016-12-12 DIAGNOSIS — E1165 Type 2 diabetes mellitus with hyperglycemia: Secondary | ICD-10-CM | POA: Diagnosis not present

## 2016-12-12 DIAGNOSIS — Z6841 Body Mass Index (BMI) 40.0 and over, adult: Secondary | ICD-10-CM | POA: Diagnosis not present

## 2016-12-12 DIAGNOSIS — I10 Essential (primary) hypertension: Secondary | ICD-10-CM | POA: Diagnosis not present

## 2016-12-12 DIAGNOSIS — Z9189 Other specified personal risk factors, not elsewhere classified: Secondary | ICD-10-CM | POA: Diagnosis not present

## 2016-12-12 DIAGNOSIS — F331 Major depressive disorder, recurrent, moderate: Secondary | ICD-10-CM | POA: Diagnosis not present

## 2016-12-12 DIAGNOSIS — K210 Gastro-esophageal reflux disease with esophagitis: Secondary | ICD-10-CM | POA: Diagnosis not present

## 2016-12-12 DIAGNOSIS — Z0001 Encounter for general adult medical examination with abnormal findings: Secondary | ICD-10-CM | POA: Diagnosis not present

## 2016-12-12 DIAGNOSIS — E782 Mixed hyperlipidemia: Secondary | ICD-10-CM | POA: Diagnosis not present

## 2016-12-17 DIAGNOSIS — G4733 Obstructive sleep apnea (adult) (pediatric): Secondary | ICD-10-CM | POA: Diagnosis not present

## 2016-12-17 DIAGNOSIS — Z0001 Encounter for general adult medical examination with abnormal findings: Secondary | ICD-10-CM | POA: Diagnosis not present

## 2016-12-17 DIAGNOSIS — E782 Mixed hyperlipidemia: Secondary | ICD-10-CM | POA: Diagnosis not present

## 2016-12-17 DIAGNOSIS — M1711 Unilateral primary osteoarthritis, right knee: Secondary | ICD-10-CM | POA: Diagnosis not present

## 2016-12-17 DIAGNOSIS — K219 Gastro-esophageal reflux disease without esophagitis: Secondary | ICD-10-CM | POA: Diagnosis not present

## 2016-12-17 DIAGNOSIS — E1165 Type 2 diabetes mellitus with hyperglycemia: Secondary | ICD-10-CM | POA: Diagnosis not present

## 2016-12-17 DIAGNOSIS — M545 Low back pain: Secondary | ICD-10-CM | POA: Diagnosis not present

## 2016-12-17 DIAGNOSIS — I10 Essential (primary) hypertension: Secondary | ICD-10-CM | POA: Diagnosis not present

## 2016-12-17 DIAGNOSIS — Z6841 Body Mass Index (BMI) 40.0 and over, adult: Secondary | ICD-10-CM | POA: Diagnosis not present

## 2016-12-17 DIAGNOSIS — F331 Major depressive disorder, recurrent, moderate: Secondary | ICD-10-CM | POA: Diagnosis not present

## 2016-12-17 DIAGNOSIS — Z79891 Long term (current) use of opiate analgesic: Secondary | ICD-10-CM | POA: Diagnosis not present

## 2016-12-17 DIAGNOSIS — Z1212 Encounter for screening for malignant neoplasm of rectum: Secondary | ICD-10-CM | POA: Diagnosis not present

## 2016-12-17 DIAGNOSIS — Z9189 Other specified personal risk factors, not elsewhere classified: Secondary | ICD-10-CM | POA: Diagnosis not present

## 2017-03-04 DIAGNOSIS — G4761 Periodic limb movement disorder: Secondary | ICD-10-CM | POA: Diagnosis not present

## 2017-03-04 DIAGNOSIS — G4733 Obstructive sleep apnea (adult) (pediatric): Secondary | ICD-10-CM | POA: Diagnosis not present

## 2017-03-16 DIAGNOSIS — D649 Anemia, unspecified: Secondary | ICD-10-CM | POA: Diagnosis not present

## 2017-03-16 DIAGNOSIS — E1165 Type 2 diabetes mellitus with hyperglycemia: Secondary | ICD-10-CM | POA: Diagnosis not present

## 2017-03-16 DIAGNOSIS — Z9189 Other specified personal risk factors, not elsewhere classified: Secondary | ICD-10-CM | POA: Diagnosis not present

## 2017-03-16 DIAGNOSIS — I10 Essential (primary) hypertension: Secondary | ICD-10-CM | POA: Diagnosis not present

## 2017-03-16 DIAGNOSIS — K210 Gastro-esophageal reflux disease with esophagitis: Secondary | ICD-10-CM | POA: Diagnosis not present

## 2017-03-16 DIAGNOSIS — M545 Low back pain: Secondary | ICD-10-CM | POA: Diagnosis not present

## 2017-03-16 DIAGNOSIS — E782 Mixed hyperlipidemia: Secondary | ICD-10-CM | POA: Diagnosis not present

## 2017-03-16 DIAGNOSIS — Z79891 Long term (current) use of opiate analgesic: Secondary | ICD-10-CM | POA: Diagnosis not present

## 2017-03-23 DIAGNOSIS — E782 Mixed hyperlipidemia: Secondary | ICD-10-CM | POA: Diagnosis not present

## 2017-03-23 DIAGNOSIS — E1165 Type 2 diabetes mellitus with hyperglycemia: Secondary | ICD-10-CM | POA: Diagnosis not present

## 2017-03-23 DIAGNOSIS — I10 Essential (primary) hypertension: Secondary | ICD-10-CM | POA: Diagnosis not present

## 2017-03-23 DIAGNOSIS — M19011 Primary osteoarthritis, right shoulder: Secondary | ICD-10-CM | POA: Diagnosis not present

## 2017-03-23 DIAGNOSIS — M1711 Unilateral primary osteoarthritis, right knee: Secondary | ICD-10-CM | POA: Diagnosis not present

## 2017-03-23 DIAGNOSIS — Z6841 Body Mass Index (BMI) 40.0 and over, adult: Secondary | ICD-10-CM | POA: Diagnosis not present

## 2017-03-23 DIAGNOSIS — Z1389 Encounter for screening for other disorder: Secondary | ICD-10-CM | POA: Diagnosis not present

## 2017-03-23 DIAGNOSIS — Z79891 Long term (current) use of opiate analgesic: Secondary | ICD-10-CM | POA: Diagnosis not present

## 2017-06-18 DIAGNOSIS — Z79891 Long term (current) use of opiate analgesic: Secondary | ICD-10-CM | POA: Diagnosis not present

## 2017-06-18 DIAGNOSIS — E1165 Type 2 diabetes mellitus with hyperglycemia: Secondary | ICD-10-CM | POA: Diagnosis not present

## 2017-06-18 DIAGNOSIS — I10 Essential (primary) hypertension: Secondary | ICD-10-CM | POA: Diagnosis not present

## 2017-06-18 DIAGNOSIS — E782 Mixed hyperlipidemia: Secondary | ICD-10-CM | POA: Diagnosis not present

## 2017-06-18 DIAGNOSIS — K210 Gastro-esophageal reflux disease with esophagitis: Secondary | ICD-10-CM | POA: Diagnosis not present

## 2017-06-18 DIAGNOSIS — G4733 Obstructive sleep apnea (adult) (pediatric): Secondary | ICD-10-CM | POA: Diagnosis not present

## 2017-06-18 DIAGNOSIS — F331 Major depressive disorder, recurrent, moderate: Secondary | ICD-10-CM | POA: Diagnosis not present

## 2017-06-18 DIAGNOSIS — Z9189 Other specified personal risk factors, not elsewhere classified: Secondary | ICD-10-CM | POA: Diagnosis not present

## 2017-06-24 DIAGNOSIS — E782 Mixed hyperlipidemia: Secondary | ICD-10-CM | POA: Diagnosis not present

## 2017-06-24 DIAGNOSIS — E1165 Type 2 diabetes mellitus with hyperglycemia: Secondary | ICD-10-CM | POA: Diagnosis not present

## 2017-06-24 DIAGNOSIS — Z1389 Encounter for screening for other disorder: Secondary | ICD-10-CM | POA: Diagnosis not present

## 2017-06-24 DIAGNOSIS — Z79891 Long term (current) use of opiate analgesic: Secondary | ICD-10-CM | POA: Diagnosis not present

## 2017-06-24 DIAGNOSIS — I10 Essential (primary) hypertension: Secondary | ICD-10-CM | POA: Diagnosis not present

## 2017-06-24 DIAGNOSIS — Z6841 Body Mass Index (BMI) 40.0 and over, adult: Secondary | ICD-10-CM | POA: Diagnosis not present

## 2017-06-24 DIAGNOSIS — F331 Major depressive disorder, recurrent, moderate: Secondary | ICD-10-CM | POA: Diagnosis not present

## 2017-06-30 DIAGNOSIS — G4733 Obstructive sleep apnea (adult) (pediatric): Secondary | ICD-10-CM | POA: Diagnosis not present

## 2017-07-01 DIAGNOSIS — G4733 Obstructive sleep apnea (adult) (pediatric): Secondary | ICD-10-CM | POA: Diagnosis not present

## 2017-09-22 DIAGNOSIS — Z1231 Encounter for screening mammogram for malignant neoplasm of breast: Secondary | ICD-10-CM | POA: Diagnosis not present

## 2017-09-23 DIAGNOSIS — F331 Major depressive disorder, recurrent, moderate: Secondary | ICD-10-CM | POA: Diagnosis not present

## 2017-09-23 DIAGNOSIS — I10 Essential (primary) hypertension: Secondary | ICD-10-CM | POA: Diagnosis not present

## 2017-09-23 DIAGNOSIS — G4733 Obstructive sleep apnea (adult) (pediatric): Secondary | ICD-10-CM | POA: Diagnosis not present

## 2017-09-23 DIAGNOSIS — E782 Mixed hyperlipidemia: Secondary | ICD-10-CM | POA: Diagnosis not present

## 2017-09-24 DIAGNOSIS — M1711 Unilateral primary osteoarthritis, right knee: Secondary | ICD-10-CM | POA: Diagnosis not present

## 2017-09-24 DIAGNOSIS — Z1389 Encounter for screening for other disorder: Secondary | ICD-10-CM | POA: Diagnosis not present

## 2017-09-24 DIAGNOSIS — Z1212 Encounter for screening for malignant neoplasm of rectum: Secondary | ICD-10-CM | POA: Diagnosis not present

## 2017-09-24 DIAGNOSIS — Z9189 Other specified personal risk factors, not elsewhere classified: Secondary | ICD-10-CM | POA: Diagnosis not present

## 2017-09-24 DIAGNOSIS — E1165 Type 2 diabetes mellitus with hyperglycemia: Secondary | ICD-10-CM | POA: Diagnosis not present

## 2017-09-24 DIAGNOSIS — I10 Essential (primary) hypertension: Secondary | ICD-10-CM | POA: Diagnosis not present

## 2017-09-24 DIAGNOSIS — M545 Low back pain: Secondary | ICD-10-CM | POA: Diagnosis not present

## 2017-09-24 DIAGNOSIS — Z6841 Body Mass Index (BMI) 40.0 and over, adult: Secondary | ICD-10-CM | POA: Diagnosis not present

## 2017-09-24 DIAGNOSIS — G4733 Obstructive sleep apnea (adult) (pediatric): Secondary | ICD-10-CM | POA: Diagnosis not present

## 2017-09-24 DIAGNOSIS — E782 Mixed hyperlipidemia: Secondary | ICD-10-CM | POA: Diagnosis not present

## 2017-09-24 DIAGNOSIS — K219 Gastro-esophageal reflux disease without esophagitis: Secondary | ICD-10-CM | POA: Diagnosis not present

## 2017-09-24 DIAGNOSIS — F331 Major depressive disorder, recurrent, moderate: Secondary | ICD-10-CM | POA: Diagnosis not present

## 2017-09-24 DIAGNOSIS — Z79891 Long term (current) use of opiate analgesic: Secondary | ICD-10-CM | POA: Diagnosis not present

## 2017-09-24 DIAGNOSIS — Z0001 Encounter for general adult medical examination with abnormal findings: Secondary | ICD-10-CM | POA: Diagnosis not present

## 2017-09-24 DIAGNOSIS — Z23 Encounter for immunization: Secondary | ICD-10-CM | POA: Diagnosis not present

## 2017-10-01 DIAGNOSIS — K760 Fatty (change of) liver, not elsewhere classified: Secondary | ICD-10-CM | POA: Diagnosis not present

## 2017-10-01 DIAGNOSIS — K8020 Calculus of gallbladder without cholecystitis without obstruction: Secondary | ICD-10-CM | POA: Diagnosis not present

## 2017-10-05 DIAGNOSIS — Z01419 Encounter for gynecological examination (general) (routine) without abnormal findings: Secondary | ICD-10-CM | POA: Diagnosis not present

## 2017-11-02 DIAGNOSIS — K8050 Calculus of bile duct without cholangitis or cholecystitis without obstruction: Secondary | ICD-10-CM | POA: Diagnosis not present

## 2017-11-16 DIAGNOSIS — R05 Cough: Secondary | ICD-10-CM | POA: Diagnosis not present

## 2017-11-16 DIAGNOSIS — J019 Acute sinusitis, unspecified: Secondary | ICD-10-CM | POA: Diagnosis not present

## 2017-11-16 DIAGNOSIS — Z6841 Body Mass Index (BMI) 40.0 and over, adult: Secondary | ICD-10-CM | POA: Diagnosis not present

## 2017-11-20 DIAGNOSIS — M25561 Pain in right knee: Secondary | ICD-10-CM | POA: Diagnosis not present

## 2017-11-20 DIAGNOSIS — Z471 Aftercare following joint replacement surgery: Secondary | ICD-10-CM | POA: Diagnosis not present

## 2017-11-20 DIAGNOSIS — Z96642 Presence of left artificial hip joint: Secondary | ICD-10-CM | POA: Diagnosis not present

## 2017-11-20 DIAGNOSIS — M1711 Unilateral primary osteoarthritis, right knee: Secondary | ICD-10-CM | POA: Diagnosis not present

## 2017-11-20 DIAGNOSIS — Z6841 Body Mass Index (BMI) 40.0 and over, adult: Secondary | ICD-10-CM | POA: Diagnosis not present

## 2017-11-23 DIAGNOSIS — G4730 Sleep apnea, unspecified: Secondary | ICD-10-CM | POA: Diagnosis not present

## 2017-11-23 DIAGNOSIS — I10 Essential (primary) hypertension: Secondary | ICD-10-CM | POA: Diagnosis not present

## 2017-11-23 DIAGNOSIS — F419 Anxiety disorder, unspecified: Secondary | ICD-10-CM | POA: Diagnosis not present

## 2017-11-23 DIAGNOSIS — E119 Type 2 diabetes mellitus without complications: Secondary | ICD-10-CM | POA: Diagnosis not present

## 2017-11-23 DIAGNOSIS — K219 Gastro-esophageal reflux disease without esophagitis: Secondary | ICD-10-CM | POA: Diagnosis not present

## 2017-11-23 DIAGNOSIS — Z79899 Other long term (current) drug therapy: Secondary | ICD-10-CM | POA: Diagnosis not present

## 2017-11-23 DIAGNOSIS — M1990 Unspecified osteoarthritis, unspecified site: Secondary | ICD-10-CM | POA: Diagnosis not present

## 2017-11-23 DIAGNOSIS — F329 Major depressive disorder, single episode, unspecified: Secondary | ICD-10-CM | POA: Diagnosis not present

## 2017-11-23 DIAGNOSIS — K8044 Calculus of bile duct with chronic cholecystitis without obstruction: Secondary | ICD-10-CM | POA: Diagnosis not present

## 2017-11-24 DIAGNOSIS — Z79899 Other long term (current) drug therapy: Secondary | ICD-10-CM | POA: Diagnosis not present

## 2017-11-24 DIAGNOSIS — F419 Anxiety disorder, unspecified: Secondary | ICD-10-CM | POA: Diagnosis not present

## 2017-11-24 DIAGNOSIS — K8044 Calculus of bile duct with chronic cholecystitis without obstruction: Secondary | ICD-10-CM | POA: Diagnosis not present

## 2017-11-24 DIAGNOSIS — K8050 Calculus of bile duct without cholangitis or cholecystitis without obstruction: Secondary | ICD-10-CM | POA: Diagnosis not present

## 2017-11-24 DIAGNOSIS — M1990 Unspecified osteoarthritis, unspecified site: Secondary | ICD-10-CM | POA: Diagnosis not present

## 2017-11-24 DIAGNOSIS — F329 Major depressive disorder, single episode, unspecified: Secondary | ICD-10-CM | POA: Diagnosis not present

## 2017-11-24 DIAGNOSIS — I10 Essential (primary) hypertension: Secondary | ICD-10-CM | POA: Diagnosis not present

## 2017-11-24 DIAGNOSIS — E119 Type 2 diabetes mellitus without complications: Secondary | ICD-10-CM | POA: Diagnosis not present

## 2017-11-24 DIAGNOSIS — G4730 Sleep apnea, unspecified: Secondary | ICD-10-CM | POA: Diagnosis not present

## 2017-11-24 DIAGNOSIS — K8010 Calculus of gallbladder with chronic cholecystitis without obstruction: Secondary | ICD-10-CM | POA: Diagnosis not present

## 2017-11-24 DIAGNOSIS — K219 Gastro-esophageal reflux disease without esophagitis: Secondary | ICD-10-CM | POA: Diagnosis not present

## 2017-11-25 DIAGNOSIS — F329 Major depressive disorder, single episode, unspecified: Secondary | ICD-10-CM | POA: Diagnosis not present

## 2017-11-25 DIAGNOSIS — M19011 Primary osteoarthritis, right shoulder: Secondary | ICD-10-CM | POA: Diagnosis not present

## 2017-11-25 DIAGNOSIS — M1990 Unspecified osteoarthritis, unspecified site: Secondary | ICD-10-CM | POA: Diagnosis not present

## 2017-11-25 DIAGNOSIS — E119 Type 2 diabetes mellitus without complications: Secondary | ICD-10-CM | POA: Diagnosis not present

## 2017-11-25 DIAGNOSIS — F419 Anxiety disorder, unspecified: Secondary | ICD-10-CM | POA: Diagnosis not present

## 2017-11-25 DIAGNOSIS — E782 Mixed hyperlipidemia: Secondary | ICD-10-CM | POA: Diagnosis not present

## 2017-11-25 DIAGNOSIS — K8044 Calculus of bile duct with chronic cholecystitis without obstruction: Secondary | ICD-10-CM | POA: Diagnosis not present

## 2017-11-25 DIAGNOSIS — I10 Essential (primary) hypertension: Secondary | ICD-10-CM | POA: Diagnosis not present

## 2017-11-25 DIAGNOSIS — F331 Major depressive disorder, recurrent, moderate: Secondary | ICD-10-CM | POA: Diagnosis not present

## 2017-11-25 DIAGNOSIS — E1165 Type 2 diabetes mellitus with hyperglycemia: Secondary | ICD-10-CM | POA: Diagnosis not present

## 2017-11-25 DIAGNOSIS — G4730 Sleep apnea, unspecified: Secondary | ICD-10-CM | POA: Diagnosis not present

## 2017-11-25 DIAGNOSIS — K219 Gastro-esophageal reflux disease without esophagitis: Secondary | ICD-10-CM | POA: Diagnosis not present

## 2017-11-25 DIAGNOSIS — Z79899 Other long term (current) drug therapy: Secondary | ICD-10-CM | POA: Diagnosis not present

## 2017-12-08 DIAGNOSIS — G4733 Obstructive sleep apnea (adult) (pediatric): Secondary | ICD-10-CM | POA: Diagnosis not present

## 2017-12-22 DIAGNOSIS — I10 Essential (primary) hypertension: Secondary | ICD-10-CM | POA: Diagnosis not present

## 2017-12-22 DIAGNOSIS — E1165 Type 2 diabetes mellitus with hyperglycemia: Secondary | ICD-10-CM | POA: Diagnosis not present

## 2017-12-22 DIAGNOSIS — G4733 Obstructive sleep apnea (adult) (pediatric): Secondary | ICD-10-CM | POA: Diagnosis not present

## 2017-12-22 DIAGNOSIS — K210 Gastro-esophageal reflux disease with esophagitis: Secondary | ICD-10-CM | POA: Diagnosis not present

## 2017-12-22 DIAGNOSIS — Z79891 Long term (current) use of opiate analgesic: Secondary | ICD-10-CM | POA: Diagnosis not present

## 2017-12-22 DIAGNOSIS — E782 Mixed hyperlipidemia: Secondary | ICD-10-CM | POA: Diagnosis not present

## 2017-12-25 DIAGNOSIS — I10 Essential (primary) hypertension: Secondary | ICD-10-CM | POA: Diagnosis not present

## 2017-12-25 DIAGNOSIS — Z1389 Encounter for screening for other disorder: Secondary | ICD-10-CM | POA: Diagnosis not present

## 2017-12-25 DIAGNOSIS — Z79891 Long term (current) use of opiate analgesic: Secondary | ICD-10-CM | POA: Diagnosis not present

## 2017-12-25 DIAGNOSIS — D509 Iron deficiency anemia, unspecified: Secondary | ICD-10-CM | POA: Diagnosis not present

## 2017-12-25 DIAGNOSIS — Z0000 Encounter for general adult medical examination without abnormal findings: Secondary | ICD-10-CM | POA: Diagnosis not present

## 2017-12-25 DIAGNOSIS — Z23 Encounter for immunization: Secondary | ICD-10-CM | POA: Diagnosis not present

## 2017-12-25 DIAGNOSIS — E1165 Type 2 diabetes mellitus with hyperglycemia: Secondary | ICD-10-CM | POA: Diagnosis not present

## 2017-12-25 DIAGNOSIS — Z1212 Encounter for screening for malignant neoplasm of rectum: Secondary | ICD-10-CM | POA: Diagnosis not present

## 2018-01-07 DIAGNOSIS — G4733 Obstructive sleep apnea (adult) (pediatric): Secondary | ICD-10-CM | POA: Diagnosis not present

## 2018-02-05 DIAGNOSIS — M170 Bilateral primary osteoarthritis of knee: Secondary | ICD-10-CM | POA: Diagnosis not present

## 2018-02-05 DIAGNOSIS — M1712 Unilateral primary osteoarthritis, left knee: Secondary | ICD-10-CM | POA: Diagnosis not present

## 2018-02-05 DIAGNOSIS — M1711 Unilateral primary osteoarthritis, right knee: Secondary | ICD-10-CM | POA: Diagnosis not present

## 2018-02-07 DIAGNOSIS — G4733 Obstructive sleep apnea (adult) (pediatric): Secondary | ICD-10-CM | POA: Diagnosis not present

## 2018-02-22 DIAGNOSIS — D649 Anemia, unspecified: Secondary | ICD-10-CM | POA: Diagnosis not present

## 2018-02-22 DIAGNOSIS — D509 Iron deficiency anemia, unspecified: Secondary | ICD-10-CM | POA: Diagnosis not present

## 2018-02-22 DIAGNOSIS — D529 Folate deficiency anemia, unspecified: Secondary | ICD-10-CM | POA: Diagnosis not present

## 2018-02-22 DIAGNOSIS — D519 Vitamin B12 deficiency anemia, unspecified: Secondary | ICD-10-CM | POA: Diagnosis not present

## 2018-03-10 DIAGNOSIS — G4733 Obstructive sleep apnea (adult) (pediatric): Secondary | ICD-10-CM | POA: Diagnosis not present

## 2018-03-19 DIAGNOSIS — D649 Anemia, unspecified: Secondary | ICD-10-CM | POA: Diagnosis not present

## 2018-03-19 DIAGNOSIS — E1165 Type 2 diabetes mellitus with hyperglycemia: Secondary | ICD-10-CM | POA: Diagnosis not present

## 2018-03-19 DIAGNOSIS — Z79891 Long term (current) use of opiate analgesic: Secondary | ICD-10-CM | POA: Diagnosis not present

## 2018-03-19 DIAGNOSIS — D509 Iron deficiency anemia, unspecified: Secondary | ICD-10-CM | POA: Diagnosis not present

## 2018-03-19 DIAGNOSIS — E782 Mixed hyperlipidemia: Secondary | ICD-10-CM | POA: Diagnosis not present

## 2018-03-19 DIAGNOSIS — M1711 Unilateral primary osteoarthritis, right knee: Secondary | ICD-10-CM | POA: Diagnosis not present

## 2018-03-19 DIAGNOSIS — K210 Gastro-esophageal reflux disease with esophagitis: Secondary | ICD-10-CM | POA: Diagnosis not present

## 2018-03-19 DIAGNOSIS — I10 Essential (primary) hypertension: Secondary | ICD-10-CM | POA: Diagnosis not present

## 2018-03-22 DIAGNOSIS — E782 Mixed hyperlipidemia: Secondary | ICD-10-CM | POA: Diagnosis not present

## 2018-03-22 DIAGNOSIS — Z6841 Body Mass Index (BMI) 40.0 and over, adult: Secondary | ICD-10-CM | POA: Diagnosis not present

## 2018-03-22 DIAGNOSIS — E1165 Type 2 diabetes mellitus with hyperglycemia: Secondary | ICD-10-CM | POA: Diagnosis not present

## 2018-03-22 DIAGNOSIS — M545 Low back pain: Secondary | ICD-10-CM | POA: Diagnosis not present

## 2018-03-22 DIAGNOSIS — F331 Major depressive disorder, recurrent, moderate: Secondary | ICD-10-CM | POA: Diagnosis not present

## 2018-03-22 DIAGNOSIS — M1711 Unilateral primary osteoarthritis, right knee: Secondary | ICD-10-CM | POA: Diagnosis not present

## 2018-03-22 DIAGNOSIS — Z1389 Encounter for screening for other disorder: Secondary | ICD-10-CM | POA: Diagnosis not present

## 2018-03-22 DIAGNOSIS — K219 Gastro-esophageal reflux disease without esophagitis: Secondary | ICD-10-CM | POA: Diagnosis not present

## 2018-03-22 DIAGNOSIS — Z23 Encounter for immunization: Secondary | ICD-10-CM | POA: Diagnosis not present

## 2018-03-22 DIAGNOSIS — Z0001 Encounter for general adult medical examination with abnormal findings: Secondary | ICD-10-CM | POA: Diagnosis not present

## 2018-03-22 DIAGNOSIS — Z9189 Other specified personal risk factors, not elsewhere classified: Secondary | ICD-10-CM | POA: Diagnosis not present

## 2018-03-22 DIAGNOSIS — G4733 Obstructive sleep apnea (adult) (pediatric): Secondary | ICD-10-CM | POA: Diagnosis not present

## 2018-03-22 DIAGNOSIS — I10 Essential (primary) hypertension: Secondary | ICD-10-CM | POA: Diagnosis not present

## 2018-03-23 DIAGNOSIS — G4733 Obstructive sleep apnea (adult) (pediatric): Secondary | ICD-10-CM | POA: Diagnosis not present

## 2018-03-25 DIAGNOSIS — M1711 Unilateral primary osteoarthritis, right knee: Secondary | ICD-10-CM | POA: Diagnosis not present

## 2018-04-01 DIAGNOSIS — M1711 Unilateral primary osteoarthritis, right knee: Secondary | ICD-10-CM | POA: Diagnosis not present

## 2018-04-08 DIAGNOSIS — G4733 Obstructive sleep apnea (adult) (pediatric): Secondary | ICD-10-CM | POA: Diagnosis not present

## 2018-05-09 DIAGNOSIS — G4733 Obstructive sleep apnea (adult) (pediatric): Secondary | ICD-10-CM | POA: Diagnosis not present

## 2018-06-08 DIAGNOSIS — G4733 Obstructive sleep apnea (adult) (pediatric): Secondary | ICD-10-CM | POA: Diagnosis not present

## 2018-06-15 DIAGNOSIS — D509 Iron deficiency anemia, unspecified: Secondary | ICD-10-CM | POA: Diagnosis not present

## 2018-06-15 DIAGNOSIS — D649 Anemia, unspecified: Secondary | ICD-10-CM | POA: Diagnosis not present

## 2018-06-15 DIAGNOSIS — I10 Essential (primary) hypertension: Secondary | ICD-10-CM | POA: Diagnosis not present

## 2018-06-15 DIAGNOSIS — Z79891 Long term (current) use of opiate analgesic: Secondary | ICD-10-CM | POA: Diagnosis not present

## 2018-06-15 DIAGNOSIS — D529 Folate deficiency anemia, unspecified: Secondary | ICD-10-CM | POA: Diagnosis not present

## 2018-06-15 DIAGNOSIS — K210 Gastro-esophageal reflux disease with esophagitis: Secondary | ICD-10-CM | POA: Diagnosis not present

## 2018-06-15 DIAGNOSIS — E1165 Type 2 diabetes mellitus with hyperglycemia: Secondary | ICD-10-CM | POA: Diagnosis not present

## 2018-06-15 DIAGNOSIS — E782 Mixed hyperlipidemia: Secondary | ICD-10-CM | POA: Diagnosis not present

## 2018-06-15 DIAGNOSIS — D519 Vitamin B12 deficiency anemia, unspecified: Secondary | ICD-10-CM | POA: Diagnosis not present

## 2018-06-17 DIAGNOSIS — M25561 Pain in right knee: Secondary | ICD-10-CM | POA: Diagnosis not present

## 2018-06-17 DIAGNOSIS — M1711 Unilateral primary osteoarthritis, right knee: Secondary | ICD-10-CM | POA: Diagnosis not present

## 2018-06-25 DIAGNOSIS — E782 Mixed hyperlipidemia: Secondary | ICD-10-CM | POA: Diagnosis not present

## 2018-06-25 DIAGNOSIS — Z6841 Body Mass Index (BMI) 40.0 and over, adult: Secondary | ICD-10-CM | POA: Diagnosis not present

## 2018-06-25 DIAGNOSIS — Z1389 Encounter for screening for other disorder: Secondary | ICD-10-CM | POA: Diagnosis not present

## 2018-06-25 DIAGNOSIS — D509 Iron deficiency anemia, unspecified: Secondary | ICD-10-CM | POA: Diagnosis not present

## 2018-06-25 DIAGNOSIS — F331 Major depressive disorder, recurrent, moderate: Secondary | ICD-10-CM | POA: Diagnosis not present

## 2018-06-25 DIAGNOSIS — Z0001 Encounter for general adult medical examination with abnormal findings: Secondary | ICD-10-CM | POA: Diagnosis not present

## 2018-06-25 DIAGNOSIS — I10 Essential (primary) hypertension: Secondary | ICD-10-CM | POA: Diagnosis not present

## 2018-06-25 DIAGNOSIS — Z79891 Long term (current) use of opiate analgesic: Secondary | ICD-10-CM | POA: Diagnosis not present

## 2018-07-09 DIAGNOSIS — G4733 Obstructive sleep apnea (adult) (pediatric): Secondary | ICD-10-CM | POA: Diagnosis not present

## 2018-08-08 DIAGNOSIS — G4733 Obstructive sleep apnea (adult) (pediatric): Secondary | ICD-10-CM | POA: Diagnosis not present

## 2018-09-08 DIAGNOSIS — G4733 Obstructive sleep apnea (adult) (pediatric): Secondary | ICD-10-CM | POA: Diagnosis not present

## 2018-09-22 DIAGNOSIS — Z79891 Long term (current) use of opiate analgesic: Secondary | ICD-10-CM | POA: Diagnosis not present

## 2018-09-22 DIAGNOSIS — I10 Essential (primary) hypertension: Secondary | ICD-10-CM | POA: Diagnosis not present

## 2018-09-22 DIAGNOSIS — E782 Mixed hyperlipidemia: Secondary | ICD-10-CM | POA: Diagnosis not present

## 2018-09-22 DIAGNOSIS — D649 Anemia, unspecified: Secondary | ICD-10-CM | POA: Diagnosis not present

## 2018-09-22 DIAGNOSIS — K210 Gastro-esophageal reflux disease with esophagitis: Secondary | ICD-10-CM | POA: Diagnosis not present

## 2018-09-22 DIAGNOSIS — D509 Iron deficiency anemia, unspecified: Secondary | ICD-10-CM | POA: Diagnosis not present

## 2018-09-22 DIAGNOSIS — E1165 Type 2 diabetes mellitus with hyperglycemia: Secondary | ICD-10-CM | POA: Diagnosis not present

## 2018-09-25 DIAGNOSIS — G4733 Obstructive sleep apnea (adult) (pediatric): Secondary | ICD-10-CM | POA: Diagnosis not present

## 2018-09-28 DIAGNOSIS — E782 Mixed hyperlipidemia: Secondary | ICD-10-CM | POA: Diagnosis not present

## 2018-09-28 DIAGNOSIS — F331 Major depressive disorder, recurrent, moderate: Secondary | ICD-10-CM | POA: Diagnosis not present

## 2018-09-28 DIAGNOSIS — M545 Low back pain: Secondary | ICD-10-CM | POA: Diagnosis not present

## 2018-09-28 DIAGNOSIS — Z6841 Body Mass Index (BMI) 40.0 and over, adult: Secondary | ICD-10-CM | POA: Diagnosis not present

## 2018-09-28 DIAGNOSIS — Z9189 Other specified personal risk factors, not elsewhere classified: Secondary | ICD-10-CM | POA: Diagnosis not present

## 2018-09-28 DIAGNOSIS — G4733 Obstructive sleep apnea (adult) (pediatric): Secondary | ICD-10-CM | POA: Diagnosis not present

## 2018-09-28 DIAGNOSIS — Z0001 Encounter for general adult medical examination with abnormal findings: Secondary | ICD-10-CM | POA: Diagnosis not present

## 2018-09-28 DIAGNOSIS — K219 Gastro-esophageal reflux disease without esophagitis: Secondary | ICD-10-CM | POA: Diagnosis not present

## 2018-09-28 DIAGNOSIS — I10 Essential (primary) hypertension: Secondary | ICD-10-CM | POA: Diagnosis not present

## 2018-09-28 DIAGNOSIS — E1165 Type 2 diabetes mellitus with hyperglycemia: Secondary | ICD-10-CM | POA: Diagnosis not present

## 2018-09-28 DIAGNOSIS — M1711 Unilateral primary osteoarthritis, right knee: Secondary | ICD-10-CM | POA: Diagnosis not present

## 2018-09-28 DIAGNOSIS — D509 Iron deficiency anemia, unspecified: Secondary | ICD-10-CM | POA: Diagnosis not present

## 2018-09-28 DIAGNOSIS — G252 Other specified forms of tremor: Secondary | ICD-10-CM | POA: Diagnosis not present

## 2018-10-09 DIAGNOSIS — G4733 Obstructive sleep apnea (adult) (pediatric): Secondary | ICD-10-CM | POA: Diagnosis not present

## 2018-10-27 DIAGNOSIS — E782 Mixed hyperlipidemia: Secondary | ICD-10-CM | POA: Diagnosis not present

## 2018-10-27 DIAGNOSIS — E1165 Type 2 diabetes mellitus with hyperglycemia: Secondary | ICD-10-CM | POA: Diagnosis not present

## 2018-10-27 DIAGNOSIS — I10 Essential (primary) hypertension: Secondary | ICD-10-CM | POA: Diagnosis not present

## 2018-11-08 DIAGNOSIS — G4733 Obstructive sleep apnea (adult) (pediatric): Secondary | ICD-10-CM | POA: Diagnosis not present

## 2018-11-18 ENCOUNTER — Ambulatory Visit (INDEPENDENT_AMBULATORY_CARE_PROVIDER_SITE_OTHER): Payer: Medicare PPO | Admitting: Otolaryngology

## 2018-12-09 DIAGNOSIS — G4733 Obstructive sleep apnea (adult) (pediatric): Secondary | ICD-10-CM | POA: Diagnosis not present

## 2018-12-22 DIAGNOSIS — I10 Essential (primary) hypertension: Secondary | ICD-10-CM | POA: Diagnosis not present

## 2018-12-22 DIAGNOSIS — D509 Iron deficiency anemia, unspecified: Secondary | ICD-10-CM | POA: Diagnosis not present

## 2018-12-22 DIAGNOSIS — E782 Mixed hyperlipidemia: Secondary | ICD-10-CM | POA: Diagnosis not present

## 2018-12-22 DIAGNOSIS — E1165 Type 2 diabetes mellitus with hyperglycemia: Secondary | ICD-10-CM | POA: Diagnosis not present

## 2018-12-28 DIAGNOSIS — I10 Essential (primary) hypertension: Secondary | ICD-10-CM | POA: Diagnosis not present

## 2018-12-28 DIAGNOSIS — E1165 Type 2 diabetes mellitus with hyperglycemia: Secondary | ICD-10-CM | POA: Diagnosis not present

## 2018-12-28 DIAGNOSIS — Z6841 Body Mass Index (BMI) 40.0 and over, adult: Secondary | ICD-10-CM | POA: Diagnosis not present

## 2018-12-28 DIAGNOSIS — F331 Major depressive disorder, recurrent, moderate: Secondary | ICD-10-CM | POA: Diagnosis not present

## 2018-12-28 DIAGNOSIS — K219 Gastro-esophageal reflux disease without esophagitis: Secondary | ICD-10-CM | POA: Diagnosis not present

## 2018-12-28 DIAGNOSIS — G252 Other specified forms of tremor: Secondary | ICD-10-CM | POA: Diagnosis not present

## 2018-12-28 DIAGNOSIS — Z79891 Long term (current) use of opiate analgesic: Secondary | ICD-10-CM | POA: Diagnosis not present

## 2018-12-28 DIAGNOSIS — D509 Iron deficiency anemia, unspecified: Secondary | ICD-10-CM | POA: Diagnosis not present

## 2019-01-27 DIAGNOSIS — I10 Essential (primary) hypertension: Secondary | ICD-10-CM | POA: Diagnosis not present

## 2019-01-27 DIAGNOSIS — E782 Mixed hyperlipidemia: Secondary | ICD-10-CM | POA: Diagnosis not present

## 2019-02-03 DIAGNOSIS — R5383 Other fatigue: Secondary | ICD-10-CM | POA: Diagnosis not present

## 2019-02-03 DIAGNOSIS — U071 COVID-19: Secondary | ICD-10-CM | POA: Diagnosis not present

## 2019-02-03 DIAGNOSIS — R05 Cough: Secondary | ICD-10-CM | POA: Diagnosis not present

## 2019-02-12 DIAGNOSIS — G4733 Obstructive sleep apnea (adult) (pediatric): Secondary | ICD-10-CM | POA: Diagnosis not present

## 2019-02-17 DIAGNOSIS — Z20828 Contact with and (suspected) exposure to other viral communicable diseases: Secondary | ICD-10-CM | POA: Diagnosis not present

## 2019-02-25 DIAGNOSIS — E7849 Other hyperlipidemia: Secondary | ICD-10-CM | POA: Diagnosis not present

## 2019-02-25 DIAGNOSIS — I10 Essential (primary) hypertension: Secondary | ICD-10-CM | POA: Diagnosis not present

## 2019-02-25 DIAGNOSIS — K219 Gastro-esophageal reflux disease without esophagitis: Secondary | ICD-10-CM | POA: Diagnosis not present

## 2019-03-22 DIAGNOSIS — Z6841 Body Mass Index (BMI) 40.0 and over, adult: Secondary | ICD-10-CM | POA: Diagnosis not present

## 2019-03-22 DIAGNOSIS — M1711 Unilateral primary osteoarthritis, right knee: Secondary | ICD-10-CM | POA: Diagnosis not present

## 2019-03-22 DIAGNOSIS — H9209 Otalgia, unspecified ear: Secondary | ICD-10-CM | POA: Diagnosis not present

## 2019-03-25 DIAGNOSIS — I10 Essential (primary) hypertension: Secondary | ICD-10-CM | POA: Diagnosis not present

## 2019-03-25 DIAGNOSIS — E7849 Other hyperlipidemia: Secondary | ICD-10-CM | POA: Diagnosis not present

## 2019-03-25 DIAGNOSIS — E1165 Type 2 diabetes mellitus with hyperglycemia: Secondary | ICD-10-CM | POA: Diagnosis not present

## 2019-03-30 DIAGNOSIS — E1165 Type 2 diabetes mellitus with hyperglycemia: Secondary | ICD-10-CM | POA: Diagnosis not present

## 2019-03-30 DIAGNOSIS — D649 Anemia, unspecified: Secondary | ICD-10-CM | POA: Diagnosis not present

## 2019-03-30 DIAGNOSIS — K2100 Gastro-esophageal reflux disease with esophagitis, without bleeding: Secondary | ICD-10-CM | POA: Diagnosis not present

## 2019-03-30 DIAGNOSIS — E782 Mixed hyperlipidemia: Secondary | ICD-10-CM | POA: Diagnosis not present

## 2019-03-30 DIAGNOSIS — M1711 Unilateral primary osteoarthritis, right knee: Secondary | ICD-10-CM | POA: Diagnosis not present

## 2019-03-30 DIAGNOSIS — R946 Abnormal results of thyroid function studies: Secondary | ICD-10-CM | POA: Diagnosis not present

## 2019-03-30 DIAGNOSIS — I10 Essential (primary) hypertension: Secondary | ICD-10-CM | POA: Diagnosis not present

## 2019-03-30 DIAGNOSIS — M19011 Primary osteoarthritis, right shoulder: Secondary | ICD-10-CM | POA: Diagnosis not present

## 2019-04-04 DIAGNOSIS — M545 Low back pain: Secondary | ICD-10-CM | POA: Diagnosis not present

## 2019-04-04 DIAGNOSIS — K219 Gastro-esophageal reflux disease without esophagitis: Secondary | ICD-10-CM | POA: Diagnosis not present

## 2019-04-04 DIAGNOSIS — D509 Iron deficiency anemia, unspecified: Secondary | ICD-10-CM | POA: Diagnosis not present

## 2019-04-04 DIAGNOSIS — R4582 Worries: Secondary | ICD-10-CM | POA: Diagnosis not present

## 2019-04-04 DIAGNOSIS — Z79891 Long term (current) use of opiate analgesic: Secondary | ICD-10-CM | POA: Diagnosis not present

## 2019-04-04 DIAGNOSIS — Z9189 Other specified personal risk factors, not elsewhere classified: Secondary | ICD-10-CM | POA: Diagnosis not present

## 2019-04-04 DIAGNOSIS — M1711 Unilateral primary osteoarthritis, right knee: Secondary | ICD-10-CM | POA: Diagnosis not present

## 2019-04-04 DIAGNOSIS — E782 Mixed hyperlipidemia: Secondary | ICD-10-CM | POA: Diagnosis not present

## 2019-04-04 DIAGNOSIS — G4733 Obstructive sleep apnea (adult) (pediatric): Secondary | ICD-10-CM | POA: Diagnosis not present

## 2019-04-04 DIAGNOSIS — F331 Major depressive disorder, recurrent, moderate: Secondary | ICD-10-CM | POA: Diagnosis not present

## 2019-04-04 DIAGNOSIS — E1165 Type 2 diabetes mellitus with hyperglycemia: Secondary | ICD-10-CM | POA: Diagnosis not present

## 2019-04-04 DIAGNOSIS — I10 Essential (primary) hypertension: Secondary | ICD-10-CM | POA: Diagnosis not present

## 2019-04-20 DIAGNOSIS — J310 Chronic rhinitis: Secondary | ICD-10-CM | POA: Diagnosis not present

## 2019-04-20 DIAGNOSIS — J343 Hypertrophy of nasal turbinates: Secondary | ICD-10-CM | POA: Diagnosis not present

## 2019-04-20 DIAGNOSIS — J320 Chronic maxillary sinusitis: Secondary | ICD-10-CM | POA: Diagnosis not present

## 2019-04-27 DIAGNOSIS — E7849 Other hyperlipidemia: Secondary | ICD-10-CM | POA: Diagnosis not present

## 2019-04-27 DIAGNOSIS — I10 Essential (primary) hypertension: Secondary | ICD-10-CM | POA: Diagnosis not present

## 2019-05-27 DIAGNOSIS — F331 Major depressive disorder, recurrent, moderate: Secondary | ICD-10-CM | POA: Diagnosis not present

## 2019-05-27 DIAGNOSIS — G4733 Obstructive sleep apnea (adult) (pediatric): Secondary | ICD-10-CM | POA: Diagnosis not present

## 2019-05-27 DIAGNOSIS — E7801 Familial hypercholesterolemia: Secondary | ICD-10-CM | POA: Diagnosis not present

## 2019-06-07 DIAGNOSIS — Z1231 Encounter for screening mammogram for malignant neoplasm of breast: Secondary | ICD-10-CM | POA: Diagnosis not present

## 2019-06-15 DIAGNOSIS — G4733 Obstructive sleep apnea (adult) (pediatric): Secondary | ICD-10-CM | POA: Diagnosis not present

## 2019-06-27 DIAGNOSIS — E7849 Other hyperlipidemia: Secondary | ICD-10-CM | POA: Diagnosis not present

## 2019-06-27 DIAGNOSIS — E1165 Type 2 diabetes mellitus with hyperglycemia: Secondary | ICD-10-CM | POA: Diagnosis not present

## 2019-06-27 DIAGNOSIS — K219 Gastro-esophageal reflux disease without esophagitis: Secondary | ICD-10-CM | POA: Diagnosis not present

## 2019-06-27 DIAGNOSIS — I10 Essential (primary) hypertension: Secondary | ICD-10-CM | POA: Diagnosis not present

## 2019-07-08 DIAGNOSIS — K2100 Gastro-esophageal reflux disease with esophagitis, without bleeding: Secondary | ICD-10-CM | POA: Diagnosis not present

## 2019-07-08 DIAGNOSIS — E782 Mixed hyperlipidemia: Secondary | ICD-10-CM | POA: Diagnosis not present

## 2019-07-08 DIAGNOSIS — E1165 Type 2 diabetes mellitus with hyperglycemia: Secondary | ICD-10-CM | POA: Diagnosis not present

## 2019-07-08 DIAGNOSIS — I10 Essential (primary) hypertension: Secondary | ICD-10-CM | POA: Diagnosis not present

## 2019-07-08 DIAGNOSIS — R5383 Other fatigue: Secondary | ICD-10-CM | POA: Diagnosis not present

## 2019-07-12 DIAGNOSIS — Z6841 Body Mass Index (BMI) 40.0 and over, adult: Secondary | ICD-10-CM | POA: Diagnosis not present

## 2019-07-12 DIAGNOSIS — F331 Major depressive disorder, recurrent, moderate: Secondary | ICD-10-CM | POA: Diagnosis not present

## 2019-07-12 DIAGNOSIS — I10 Essential (primary) hypertension: Secondary | ICD-10-CM | POA: Diagnosis not present

## 2019-07-12 DIAGNOSIS — R4582 Worries: Secondary | ICD-10-CM | POA: Diagnosis not present

## 2019-07-12 DIAGNOSIS — M19011 Primary osteoarthritis, right shoulder: Secondary | ICD-10-CM | POA: Diagnosis not present

## 2019-07-12 DIAGNOSIS — D509 Iron deficiency anemia, unspecified: Secondary | ICD-10-CM | POA: Diagnosis not present

## 2019-07-12 DIAGNOSIS — E782 Mixed hyperlipidemia: Secondary | ICD-10-CM | POA: Diagnosis not present

## 2019-07-12 DIAGNOSIS — E1165 Type 2 diabetes mellitus with hyperglycemia: Secondary | ICD-10-CM | POA: Diagnosis not present

## 2019-07-14 DIAGNOSIS — M1711 Unilateral primary osteoarthritis, right knee: Secondary | ICD-10-CM | POA: Diagnosis not present

## 2019-07-14 DIAGNOSIS — M25551 Pain in right hip: Secondary | ICD-10-CM | POA: Diagnosis not present

## 2019-08-03 DIAGNOSIS — M25551 Pain in right hip: Secondary | ICD-10-CM | POA: Diagnosis not present

## 2019-08-26 DIAGNOSIS — E1165 Type 2 diabetes mellitus with hyperglycemia: Secondary | ICD-10-CM | POA: Diagnosis not present

## 2019-08-26 DIAGNOSIS — I10 Essential (primary) hypertension: Secondary | ICD-10-CM | POA: Diagnosis not present

## 2019-08-26 DIAGNOSIS — E7849 Other hyperlipidemia: Secondary | ICD-10-CM | POA: Diagnosis not present

## 2019-08-26 DIAGNOSIS — K219 Gastro-esophageal reflux disease without esophagitis: Secondary | ICD-10-CM | POA: Diagnosis not present

## 2019-08-31 DIAGNOSIS — M79671 Pain in right foot: Secondary | ICD-10-CM | POA: Diagnosis not present

## 2019-08-31 DIAGNOSIS — L603 Nail dystrophy: Secondary | ICD-10-CM | POA: Diagnosis not present

## 2019-08-31 DIAGNOSIS — M19071 Primary osteoarthritis, right ankle and foot: Secondary | ICD-10-CM | POA: Diagnosis not present

## 2019-08-31 DIAGNOSIS — M2011 Hallux valgus (acquired), right foot: Secondary | ICD-10-CM | POA: Diagnosis not present

## 2019-08-31 DIAGNOSIS — B351 Tinea unguium: Secondary | ICD-10-CM | POA: Diagnosis not present

## 2019-08-31 DIAGNOSIS — M79673 Pain in unspecified foot: Secondary | ICD-10-CM | POA: Diagnosis not present

## 2019-08-31 DIAGNOSIS — E119 Type 2 diabetes mellitus without complications: Secondary | ICD-10-CM | POA: Diagnosis not present

## 2019-08-31 DIAGNOSIS — M79672 Pain in left foot: Secondary | ICD-10-CM | POA: Diagnosis not present

## 2019-08-31 DIAGNOSIS — M21372 Foot drop, left foot: Secondary | ICD-10-CM | POA: Diagnosis not present

## 2019-08-31 DIAGNOSIS — M19072 Primary osteoarthritis, left ankle and foot: Secondary | ICD-10-CM | POA: Diagnosis not present

## 2019-09-02 DIAGNOSIS — E119 Type 2 diabetes mellitus without complications: Secondary | ICD-10-CM | POA: Diagnosis not present

## 2019-09-02 DIAGNOSIS — Z135 Encounter for screening for eye and ear disorders: Secondary | ICD-10-CM | POA: Diagnosis not present

## 2019-09-02 DIAGNOSIS — H2513 Age-related nuclear cataract, bilateral: Secondary | ICD-10-CM | POA: Diagnosis not present

## 2019-09-02 DIAGNOSIS — H5213 Myopia, bilateral: Secondary | ICD-10-CM | POA: Diagnosis not present

## 2019-09-02 DIAGNOSIS — H524 Presbyopia: Secondary | ICD-10-CM | POA: Diagnosis not present

## 2019-09-08 DIAGNOSIS — M25551 Pain in right hip: Secondary | ICD-10-CM | POA: Diagnosis not present

## 2019-09-08 DIAGNOSIS — M1611 Unilateral primary osteoarthritis, right hip: Secondary | ICD-10-CM | POA: Diagnosis not present

## 2019-09-22 DIAGNOSIS — R7989 Other specified abnormal findings of blood chemistry: Secondary | ICD-10-CM | POA: Diagnosis not present

## 2019-09-22 DIAGNOSIS — B353 Tinea pedis: Secondary | ICD-10-CM | POA: Diagnosis not present

## 2019-09-22 DIAGNOSIS — M21372 Foot drop, left foot: Secondary | ICD-10-CM | POA: Diagnosis not present

## 2019-09-22 DIAGNOSIS — B351 Tinea unguium: Secondary | ICD-10-CM | POA: Diagnosis not present

## 2019-09-22 DIAGNOSIS — R6889 Other general symptoms and signs: Secondary | ICD-10-CM | POA: Diagnosis not present

## 2019-09-22 DIAGNOSIS — M19079 Primary osteoarthritis, unspecified ankle and foot: Secondary | ICD-10-CM | POA: Diagnosis not present

## 2019-09-22 DIAGNOSIS — E119 Type 2 diabetes mellitus without complications: Secondary | ICD-10-CM | POA: Diagnosis not present

## 2019-09-22 DIAGNOSIS — E559 Vitamin D deficiency, unspecified: Secondary | ICD-10-CM | POA: Diagnosis not present

## 2019-09-22 DIAGNOSIS — M79673 Pain in unspecified foot: Secondary | ICD-10-CM | POA: Diagnosis not present

## 2019-09-27 DIAGNOSIS — E7849 Other hyperlipidemia: Secondary | ICD-10-CM | POA: Diagnosis not present

## 2019-09-27 DIAGNOSIS — E1165 Type 2 diabetes mellitus with hyperglycemia: Secondary | ICD-10-CM | POA: Diagnosis not present

## 2019-09-27 DIAGNOSIS — K219 Gastro-esophageal reflux disease without esophagitis: Secondary | ICD-10-CM | POA: Diagnosis not present

## 2019-09-27 DIAGNOSIS — I10 Essential (primary) hypertension: Secondary | ICD-10-CM | POA: Diagnosis not present

## 2019-10-05 DIAGNOSIS — R946 Abnormal results of thyroid function studies: Secondary | ICD-10-CM | POA: Diagnosis not present

## 2019-10-05 DIAGNOSIS — R5383 Other fatigue: Secondary | ICD-10-CM | POA: Diagnosis not present

## 2019-10-05 DIAGNOSIS — K2100 Gastro-esophageal reflux disease with esophagitis, without bleeding: Secondary | ICD-10-CM | POA: Diagnosis not present

## 2019-10-05 DIAGNOSIS — E782 Mixed hyperlipidemia: Secondary | ICD-10-CM | POA: Diagnosis not present

## 2019-10-05 DIAGNOSIS — D649 Anemia, unspecified: Secondary | ICD-10-CM | POA: Diagnosis not present

## 2019-10-05 DIAGNOSIS — D519 Vitamin B12 deficiency anemia, unspecified: Secondary | ICD-10-CM | POA: Diagnosis not present

## 2019-10-05 DIAGNOSIS — D529 Folate deficiency anemia, unspecified: Secondary | ICD-10-CM | POA: Diagnosis not present

## 2019-10-05 DIAGNOSIS — I10 Essential (primary) hypertension: Secondary | ICD-10-CM | POA: Diagnosis not present

## 2019-10-05 DIAGNOSIS — E1165 Type 2 diabetes mellitus with hyperglycemia: Secondary | ICD-10-CM | POA: Diagnosis not present

## 2019-10-11 DIAGNOSIS — Z1389 Encounter for screening for other disorder: Secondary | ICD-10-CM | POA: Diagnosis not present

## 2019-10-11 DIAGNOSIS — E1165 Type 2 diabetes mellitus with hyperglycemia: Secondary | ICD-10-CM | POA: Diagnosis not present

## 2019-10-11 DIAGNOSIS — Z0001 Encounter for general adult medical examination with abnormal findings: Secondary | ICD-10-CM | POA: Diagnosis not present

## 2019-10-11 DIAGNOSIS — Z1331 Encounter for screening for depression: Secondary | ICD-10-CM | POA: Diagnosis not present

## 2019-10-11 DIAGNOSIS — E782 Mixed hyperlipidemia: Secondary | ICD-10-CM | POA: Diagnosis not present

## 2019-10-11 DIAGNOSIS — I10 Essential (primary) hypertension: Secondary | ICD-10-CM | POA: Diagnosis not present

## 2019-10-11 DIAGNOSIS — F331 Major depressive disorder, recurrent, moderate: Secondary | ICD-10-CM | POA: Diagnosis not present

## 2019-10-11 DIAGNOSIS — Z79891 Long term (current) use of opiate analgesic: Secondary | ICD-10-CM | POA: Diagnosis not present

## 2019-11-03 DIAGNOSIS — M21372 Foot drop, left foot: Secondary | ICD-10-CM | POA: Diagnosis not present

## 2019-11-03 DIAGNOSIS — M79673 Pain in unspecified foot: Secondary | ICD-10-CM | POA: Diagnosis not present

## 2019-11-03 DIAGNOSIS — B353 Tinea pedis: Secondary | ICD-10-CM | POA: Diagnosis not present

## 2019-11-03 DIAGNOSIS — E119 Type 2 diabetes mellitus without complications: Secondary | ICD-10-CM | POA: Diagnosis not present

## 2019-11-03 DIAGNOSIS — M19079 Primary osteoarthritis, unspecified ankle and foot: Secondary | ICD-10-CM | POA: Diagnosis not present

## 2019-11-03 DIAGNOSIS — B351 Tinea unguium: Secondary | ICD-10-CM | POA: Diagnosis not present

## 2019-11-09 DIAGNOSIS — E782 Mixed hyperlipidemia: Secondary | ICD-10-CM | POA: Diagnosis not present

## 2019-11-09 DIAGNOSIS — R4582 Worries: Secondary | ICD-10-CM | POA: Diagnosis not present

## 2019-11-09 DIAGNOSIS — Z7189 Other specified counseling: Secondary | ICD-10-CM | POA: Diagnosis not present

## 2019-11-09 DIAGNOSIS — E1165 Type 2 diabetes mellitus with hyperglycemia: Secondary | ICD-10-CM | POA: Diagnosis not present

## 2019-11-09 DIAGNOSIS — M25551 Pain in right hip: Secondary | ICD-10-CM | POA: Diagnosis not present

## 2019-11-09 DIAGNOSIS — Z6841 Body Mass Index (BMI) 40.0 and over, adult: Secondary | ICD-10-CM | POA: Diagnosis not present

## 2019-11-09 DIAGNOSIS — I10 Essential (primary) hypertension: Secondary | ICD-10-CM | POA: Diagnosis not present

## 2019-11-09 DIAGNOSIS — D509 Iron deficiency anemia, unspecified: Secondary | ICD-10-CM | POA: Diagnosis not present

## 2019-11-09 DIAGNOSIS — F331 Major depressive disorder, recurrent, moderate: Secondary | ICD-10-CM | POA: Diagnosis not present

## 2019-11-18 DIAGNOSIS — G4733 Obstructive sleep apnea (adult) (pediatric): Secondary | ICD-10-CM | POA: Diagnosis not present

## 2019-11-26 DIAGNOSIS — E7849 Other hyperlipidemia: Secondary | ICD-10-CM | POA: Diagnosis not present

## 2019-11-26 DIAGNOSIS — E1165 Type 2 diabetes mellitus with hyperglycemia: Secondary | ICD-10-CM | POA: Diagnosis not present

## 2019-11-26 DIAGNOSIS — I10 Essential (primary) hypertension: Secondary | ICD-10-CM | POA: Diagnosis not present

## 2019-12-27 DIAGNOSIS — I10 Essential (primary) hypertension: Secondary | ICD-10-CM | POA: Diagnosis not present

## 2019-12-27 DIAGNOSIS — E1165 Type 2 diabetes mellitus with hyperglycemia: Secondary | ICD-10-CM | POA: Diagnosis not present

## 2019-12-27 DIAGNOSIS — E7849 Other hyperlipidemia: Secondary | ICD-10-CM | POA: Diagnosis not present

## 2020-01-05 DIAGNOSIS — E782 Mixed hyperlipidemia: Secondary | ICD-10-CM | POA: Diagnosis not present

## 2020-01-05 DIAGNOSIS — I10 Essential (primary) hypertension: Secondary | ICD-10-CM | POA: Diagnosis not present

## 2020-01-05 DIAGNOSIS — E1165 Type 2 diabetes mellitus with hyperglycemia: Secondary | ICD-10-CM | POA: Diagnosis not present

## 2020-01-05 DIAGNOSIS — K2100 Gastro-esophageal reflux disease with esophagitis, without bleeding: Secondary | ICD-10-CM | POA: Diagnosis not present

## 2020-01-05 DIAGNOSIS — R946 Abnormal results of thyroid function studies: Secondary | ICD-10-CM | POA: Diagnosis not present

## 2020-01-06 DIAGNOSIS — I10 Essential (primary) hypertension: Secondary | ICD-10-CM | POA: Diagnosis not present

## 2020-01-06 DIAGNOSIS — Z1159 Encounter for screening for other viral diseases: Secondary | ICD-10-CM | POA: Diagnosis not present

## 2020-01-06 DIAGNOSIS — R946 Abnormal results of thyroid function studies: Secondary | ICD-10-CM | POA: Diagnosis not present

## 2020-01-06 DIAGNOSIS — E1165 Type 2 diabetes mellitus with hyperglycemia: Secondary | ICD-10-CM | POA: Diagnosis not present

## 2020-01-06 DIAGNOSIS — K2100 Gastro-esophageal reflux disease with esophagitis, without bleeding: Secondary | ICD-10-CM | POA: Diagnosis not present

## 2020-01-06 DIAGNOSIS — E782 Mixed hyperlipidemia: Secondary | ICD-10-CM | POA: Diagnosis not present

## 2020-01-06 DIAGNOSIS — R5383 Other fatigue: Secondary | ICD-10-CM | POA: Diagnosis not present

## 2020-01-09 DIAGNOSIS — F331 Major depressive disorder, recurrent, moderate: Secondary | ICD-10-CM | POA: Diagnosis not present

## 2020-01-09 DIAGNOSIS — I10 Essential (primary) hypertension: Secondary | ICD-10-CM | POA: Diagnosis not present

## 2020-01-09 DIAGNOSIS — Z6841 Body Mass Index (BMI) 40.0 and over, adult: Secondary | ICD-10-CM | POA: Diagnosis not present

## 2020-01-09 DIAGNOSIS — E782 Mixed hyperlipidemia: Secondary | ICD-10-CM | POA: Diagnosis not present

## 2020-01-09 DIAGNOSIS — R4582 Worries: Secondary | ICD-10-CM | POA: Diagnosis not present

## 2020-01-09 DIAGNOSIS — Z0001 Encounter for general adult medical examination with abnormal findings: Secondary | ICD-10-CM | POA: Diagnosis not present

## 2020-01-09 DIAGNOSIS — Z23 Encounter for immunization: Secondary | ICD-10-CM | POA: Diagnosis not present

## 2020-01-09 DIAGNOSIS — E1165 Type 2 diabetes mellitus with hyperglycemia: Secondary | ICD-10-CM | POA: Diagnosis not present

## 2020-01-27 DIAGNOSIS — I10 Essential (primary) hypertension: Secondary | ICD-10-CM | POA: Diagnosis not present

## 2020-01-27 DIAGNOSIS — E7849 Other hyperlipidemia: Secondary | ICD-10-CM | POA: Diagnosis not present

## 2020-01-27 DIAGNOSIS — E1165 Type 2 diabetes mellitus with hyperglycemia: Secondary | ICD-10-CM | POA: Diagnosis not present

## 2020-02-07 DIAGNOSIS — M19079 Primary osteoarthritis, unspecified ankle and foot: Secondary | ICD-10-CM | POA: Diagnosis not present

## 2020-02-07 DIAGNOSIS — B353 Tinea pedis: Secondary | ICD-10-CM | POA: Diagnosis not present

## 2020-02-07 DIAGNOSIS — M79673 Pain in unspecified foot: Secondary | ICD-10-CM | POA: Diagnosis not present

## 2020-02-07 DIAGNOSIS — B351 Tinea unguium: Secondary | ICD-10-CM | POA: Diagnosis not present

## 2020-02-07 DIAGNOSIS — E119 Type 2 diabetes mellitus without complications: Secondary | ICD-10-CM | POA: Diagnosis not present

## 2020-02-07 DIAGNOSIS — M21372 Foot drop, left foot: Secondary | ICD-10-CM | POA: Diagnosis not present

## 2020-02-24 DIAGNOSIS — G4733 Obstructive sleep apnea (adult) (pediatric): Secondary | ICD-10-CM | POA: Diagnosis not present

## 2020-02-25 DIAGNOSIS — E1165 Type 2 diabetes mellitus with hyperglycemia: Secondary | ICD-10-CM | POA: Diagnosis not present

## 2020-02-25 DIAGNOSIS — I10 Essential (primary) hypertension: Secondary | ICD-10-CM | POA: Diagnosis not present

## 2020-02-25 DIAGNOSIS — K219 Gastro-esophageal reflux disease without esophagitis: Secondary | ICD-10-CM | POA: Diagnosis not present

## 2020-02-25 DIAGNOSIS — E7849 Other hyperlipidemia: Secondary | ICD-10-CM | POA: Diagnosis not present

## 2020-03-14 DIAGNOSIS — M25551 Pain in right hip: Secondary | ICD-10-CM | POA: Diagnosis not present

## 2020-03-26 DIAGNOSIS — E7849 Other hyperlipidemia: Secondary | ICD-10-CM | POA: Diagnosis not present

## 2020-03-26 DIAGNOSIS — E1165 Type 2 diabetes mellitus with hyperglycemia: Secondary | ICD-10-CM | POA: Diagnosis not present

## 2020-03-26 DIAGNOSIS — I10 Essential (primary) hypertension: Secondary | ICD-10-CM | POA: Diagnosis not present

## 2020-03-26 DIAGNOSIS — K219 Gastro-esophageal reflux disease without esophagitis: Secondary | ICD-10-CM | POA: Diagnosis not present

## 2020-04-03 DIAGNOSIS — R946 Abnormal results of thyroid function studies: Secondary | ICD-10-CM | POA: Diagnosis not present

## 2020-04-03 DIAGNOSIS — E7849 Other hyperlipidemia: Secondary | ICD-10-CM | POA: Diagnosis not present

## 2020-04-03 DIAGNOSIS — K219 Gastro-esophageal reflux disease without esophagitis: Secondary | ICD-10-CM | POA: Diagnosis not present

## 2020-04-03 DIAGNOSIS — E1165 Type 2 diabetes mellitus with hyperglycemia: Secondary | ICD-10-CM | POA: Diagnosis not present

## 2020-04-03 DIAGNOSIS — E782 Mixed hyperlipidemia: Secondary | ICD-10-CM | POA: Diagnosis not present

## 2020-04-06 DIAGNOSIS — I10 Essential (primary) hypertension: Secondary | ICD-10-CM | POA: Diagnosis not present

## 2020-04-06 DIAGNOSIS — D509 Iron deficiency anemia, unspecified: Secondary | ICD-10-CM | POA: Diagnosis not present

## 2020-04-06 DIAGNOSIS — E1165 Type 2 diabetes mellitus with hyperglycemia: Secondary | ICD-10-CM | POA: Diagnosis not present

## 2020-04-06 DIAGNOSIS — Z79891 Long term (current) use of opiate analgesic: Secondary | ICD-10-CM | POA: Diagnosis not present

## 2020-04-06 DIAGNOSIS — K2100 Gastro-esophageal reflux disease with esophagitis, without bleeding: Secondary | ICD-10-CM | POA: Diagnosis not present

## 2020-04-06 DIAGNOSIS — E7849 Other hyperlipidemia: Secondary | ICD-10-CM | POA: Diagnosis not present

## 2020-04-06 DIAGNOSIS — F331 Major depressive disorder, recurrent, moderate: Secondary | ICD-10-CM | POA: Diagnosis not present

## 2020-04-06 DIAGNOSIS — R4582 Worries: Secondary | ICD-10-CM | POA: Diagnosis not present

## 2020-04-25 DIAGNOSIS — E1165 Type 2 diabetes mellitus with hyperglycemia: Secondary | ICD-10-CM | POA: Diagnosis not present

## 2020-04-25 DIAGNOSIS — I10 Essential (primary) hypertension: Secondary | ICD-10-CM | POA: Diagnosis not present

## 2020-04-25 DIAGNOSIS — K219 Gastro-esophageal reflux disease without esophagitis: Secondary | ICD-10-CM | POA: Diagnosis not present

## 2020-04-25 DIAGNOSIS — E7849 Other hyperlipidemia: Secondary | ICD-10-CM | POA: Diagnosis not present

## 2020-05-07 DIAGNOSIS — M21372 Foot drop, left foot: Secondary | ICD-10-CM | POA: Diagnosis not present

## 2020-05-07 DIAGNOSIS — M79673 Pain in unspecified foot: Secondary | ICD-10-CM | POA: Diagnosis not present

## 2020-05-07 DIAGNOSIS — M19079 Primary osteoarthritis, unspecified ankle and foot: Secondary | ICD-10-CM | POA: Diagnosis not present

## 2020-05-07 DIAGNOSIS — E119 Type 2 diabetes mellitus without complications: Secondary | ICD-10-CM | POA: Diagnosis not present

## 2020-05-07 DIAGNOSIS — B351 Tinea unguium: Secondary | ICD-10-CM | POA: Diagnosis not present

## 2020-05-18 DIAGNOSIS — M25551 Pain in right hip: Secondary | ICD-10-CM | POA: Diagnosis not present

## 2020-05-18 DIAGNOSIS — M7062 Trochanteric bursitis, left hip: Secondary | ICD-10-CM | POA: Diagnosis not present

## 2020-05-18 DIAGNOSIS — Z6841 Body Mass Index (BMI) 40.0 and over, adult: Secondary | ICD-10-CM | POA: Diagnosis not present

## 2020-05-26 DIAGNOSIS — E7849 Other hyperlipidemia: Secondary | ICD-10-CM | POA: Diagnosis not present

## 2020-05-26 DIAGNOSIS — E1165 Type 2 diabetes mellitus with hyperglycemia: Secondary | ICD-10-CM | POA: Diagnosis not present

## 2020-05-26 DIAGNOSIS — K219 Gastro-esophageal reflux disease without esophagitis: Secondary | ICD-10-CM | POA: Diagnosis not present

## 2020-05-26 DIAGNOSIS — I10 Essential (primary) hypertension: Secondary | ICD-10-CM | POA: Diagnosis not present

## 2020-06-15 DIAGNOSIS — M25551 Pain in right hip: Secondary | ICD-10-CM | POA: Diagnosis not present

## 2020-06-25 DIAGNOSIS — I10 Essential (primary) hypertension: Secondary | ICD-10-CM | POA: Diagnosis not present

## 2020-06-25 DIAGNOSIS — E1165 Type 2 diabetes mellitus with hyperglycemia: Secondary | ICD-10-CM | POA: Diagnosis not present

## 2020-06-25 DIAGNOSIS — K219 Gastro-esophageal reflux disease without esophagitis: Secondary | ICD-10-CM | POA: Diagnosis not present

## 2020-06-25 DIAGNOSIS — E7849 Other hyperlipidemia: Secondary | ICD-10-CM | POA: Diagnosis not present

## 2020-07-19 DIAGNOSIS — E782 Mixed hyperlipidemia: Secondary | ICD-10-CM | POA: Diagnosis not present

## 2020-07-19 DIAGNOSIS — E1165 Type 2 diabetes mellitus with hyperglycemia: Secondary | ICD-10-CM | POA: Diagnosis not present

## 2020-07-19 DIAGNOSIS — Z79891 Long term (current) use of opiate analgesic: Secondary | ICD-10-CM | POA: Diagnosis not present

## 2020-07-19 DIAGNOSIS — R946 Abnormal results of thyroid function studies: Secondary | ICD-10-CM | POA: Diagnosis not present

## 2020-07-19 DIAGNOSIS — E7849 Other hyperlipidemia: Secondary | ICD-10-CM | POA: Diagnosis not present

## 2020-07-19 DIAGNOSIS — K2100 Gastro-esophageal reflux disease with esophagitis, without bleeding: Secondary | ICD-10-CM | POA: Diagnosis not present

## 2020-07-19 DIAGNOSIS — I10 Essential (primary) hypertension: Secondary | ICD-10-CM | POA: Diagnosis not present

## 2020-07-24 DIAGNOSIS — G4733 Obstructive sleep apnea (adult) (pediatric): Secondary | ICD-10-CM | POA: Diagnosis not present

## 2020-07-24 DIAGNOSIS — K2100 Gastro-esophageal reflux disease with esophagitis, without bleeding: Secondary | ICD-10-CM | POA: Diagnosis not present

## 2020-07-24 DIAGNOSIS — I10 Essential (primary) hypertension: Secondary | ICD-10-CM | POA: Diagnosis not present

## 2020-07-24 DIAGNOSIS — E7849 Other hyperlipidemia: Secondary | ICD-10-CM | POA: Diagnosis not present

## 2020-07-24 DIAGNOSIS — F331 Major depressive disorder, recurrent, moderate: Secondary | ICD-10-CM | POA: Diagnosis not present

## 2020-07-24 DIAGNOSIS — R4582 Worries: Secondary | ICD-10-CM | POA: Diagnosis not present

## 2020-07-24 DIAGNOSIS — E1165 Type 2 diabetes mellitus with hyperglycemia: Secondary | ICD-10-CM | POA: Diagnosis not present

## 2020-07-24 DIAGNOSIS — D509 Iron deficiency anemia, unspecified: Secondary | ICD-10-CM | POA: Diagnosis not present

## 2020-08-02 DIAGNOSIS — M1611 Unilateral primary osteoarthritis, right hip: Secondary | ICD-10-CM | POA: Diagnosis not present

## 2020-08-07 DIAGNOSIS — M79672 Pain in left foot: Secondary | ICD-10-CM | POA: Diagnosis not present

## 2020-08-07 DIAGNOSIS — M21372 Foot drop, left foot: Secondary | ICD-10-CM | POA: Diagnosis not present

## 2020-08-07 DIAGNOSIS — E119 Type 2 diabetes mellitus without complications: Secondary | ICD-10-CM | POA: Diagnosis not present

## 2020-08-07 DIAGNOSIS — M19079 Primary osteoarthritis, unspecified ankle and foot: Secondary | ICD-10-CM | POA: Diagnosis not present

## 2020-08-07 DIAGNOSIS — M79673 Pain in unspecified foot: Secondary | ICD-10-CM | POA: Diagnosis not present

## 2020-08-07 DIAGNOSIS — L600 Ingrowing nail: Secondary | ICD-10-CM | POA: Diagnosis not present

## 2020-08-07 DIAGNOSIS — B351 Tinea unguium: Secondary | ICD-10-CM | POA: Diagnosis not present

## 2020-08-26 DIAGNOSIS — E1165 Type 2 diabetes mellitus with hyperglycemia: Secondary | ICD-10-CM | POA: Diagnosis not present

## 2020-08-26 DIAGNOSIS — E7849 Other hyperlipidemia: Secondary | ICD-10-CM | POA: Diagnosis not present

## 2020-08-26 DIAGNOSIS — I10 Essential (primary) hypertension: Secondary | ICD-10-CM | POA: Diagnosis not present

## 2020-08-26 DIAGNOSIS — K219 Gastro-esophageal reflux disease without esophagitis: Secondary | ICD-10-CM | POA: Diagnosis not present

## 2020-08-29 DIAGNOSIS — G4733 Obstructive sleep apnea (adult) (pediatric): Secondary | ICD-10-CM | POA: Diagnosis not present

## 2020-09-11 NOTE — Progress Notes (Signed)
DUE TO COVID-19 ONLY ONE VISITOR IS ALLOWED TO COME WITH YOU AND STAY IN THE WAITING ROOM ONLY DURING PRE OP AND PROCEDURE DAY OF SURGERY.  2 VISITOR  MAY VISIT WITH YOU AFTER SURGERY IN YOUR PRIVATE ROOM DURING VISITING HOURS ONLY!  YOU NEED TO HAVE A COVID 19 TEST ON___8/29/2022 ___'@_'$  '@_from'$  8am-3pm _____, THIS TEST MUST BE DONE BEFORE SURGERY,  Covid test is done at [place], [place] [address].  This is a drive thru.  No appt required. Please see map.                 Your procedure is scheduled on:        09/26/20   Report to [facility] Main  Entrance   Report to admitting at      0900am      Call this number if you have problems the morning of surgery [phone]    REMEMBER: NO  SOLID FOOD CANDY OR GUM AFTER MIDNIGHT. CLEAR LIQUIDS UNTIL   0815am         . NOTHING BY MOUTH EXCEPT CLEAR LIQUIDS UNTIL  0815am   . PLEASE FINISH ENSURE DRINK PER SURGEON ORDER  WHICH NEEDS TO BE COMPLETED AT   0815am    .      CLEAR LIQUID DIET   Foods Allowed                                                                    Coffee and tea, regular and decaf                            Fruit ices (not with fruit pulp)                                      Iced Popsicles                                    Carbonated beverages, regular and diet                                    Cranberry, grape and apple juices Sports drinks like Gatorade Lightly seasoned clear broth or consume(fat free) Sugar, honey syrup ___________________________________________________________________      BRUSH YOUR TEETH MORNING OF SURGERY AND RINSE YOUR MOUTH OUT, NO CHEWING GUM CANDY OR MINTS.     Take these medicines the morning of surgery with A SIP OF WATER:      DO NOT TAKE ANY DIABETIC MEDICATIONS DAY OF YOUR SURGERY                               You may not have any metal on your body including hair pins and              piercings  Do not wear jewelry, make-up, lotions, powders  or perfumes, deodorant  Do not wear nail polish on your fingernails.  Do not shave  48 hours prior to surgery.              Men may shave face and neck.   Do not bring valuables to the hospital. [place].  Contacts, dentures or bridgework may not be worn into surgery.  Leave suitcase in the car. After surgery it may be brought to your room.     Patients discharged the day of surgery will not be allowed to drive home. IF YOU ARE HAVING SURGERY AND GOING HOME THE SAME DAY, YOU MUST HAVE AN ADULT TO DRIVE YOU HOME AND BE WITH YOU FOR 24 HOURS. YOU MAY GO HOME BY TAXI OR UBER OR ORTHERWISE, BUT AN ADULT MUST ACCOMPANY YOU HOME AND STAY WITH YOU FOR 24 HOURS.  Name and phone number of your driver:  Special Instructions: N/A              Please read over the following fact sheets you were given: _____________________________________________________________________  [facility] - Preparing for Surgery Before surgery, you can play an important role.  Because skin is not sterile, your skin needs to be as free of germs as possible.  You can reduce the number of germs on your skin by washing with CHG (chlorahexidine gluconate) soap before surgery.  CHG is an antiseptic cleaner which kills germs and bonds with the skin to continue killing germs even after washing. Please DO NOT use if you have an allergy to CHG or antibacterial soaps.  If your skin becomes reddened/irritated stop using the CHG and inform your nurse when you arrive at Short Stay. Do not shave (including legs and underarms) for at least 48 hours prior to the first CHG shower.  You may shave your face/neck. Please follow these instructions carefully:  1.  Shower with CHG Soap the night before surgery and the  morning of Surgery.  2.  If you choose to wash your hair, wash your hair first as usual with your  normal  shampoo.  3.  After you shampoo, rinse your hair and body  thoroughly to remove the  shampoo.                           4.  Use CHG as you would any other liquid soap.  You can apply chg directly  to the skin and wash                       Gently with a scrungie or clean washcloth.  5.  Apply the CHG Soap to your body ONLY FROM THE NECK DOWN.   Do not use on face/ open                           Wound or open sores. Avoid contact with eyes, ears mouth and genitals (private parts).                       Wash face,  Genitals (private parts) with your normal soap.             6.  Wash thoroughly, paying special attention to the area where your surgery  will be performed.  7.  Thoroughly rinse your body with  warm water from the neck down.  8.  DO NOT shower/wash with your normal soap after using and rinsing off  the CHG Soap.                9.  Pat yourself dry with a clean towel.            10.  Wear clean pajamas.            11.  Place clean sheets on your bed the night of your first shower and do not  sleep with pets. Day of Surgery : Do not apply any lotions/deodorants the morning of surgery.  Please wear clean clothes to the hospital/surgery center.  FAILURE TO FOLLOW THESE INSTRUCTIONS MAY RESULT IN THE CANCELLATION OF YOUR SURGERY PATIENT SIGNATURE_________________________________  NURSE SIGNATURE__________________________________  ________________________________________________________________________

## 2020-09-13 ENCOUNTER — Other Ambulatory Visit: Payer: Self-pay

## 2020-09-13 ENCOUNTER — Encounter (HOSPITAL_COMMUNITY)
Admission: RE | Admit: 2020-09-13 | Discharge: 2020-09-13 | Disposition: A | Discharge status: Home / Self Care | Payer: Medicare PPO | Source: Ambulatory Visit | Attending: Orthopedic Surgery | Admitting: Orthopedic Surgery

## 2020-09-13 ENCOUNTER — Encounter (HOSPITAL_COMMUNITY): Payer: Self-pay

## 2020-09-13 DIAGNOSIS — Z01818 Encounter for other preprocedural examination: Secondary | ICD-10-CM | POA: Diagnosis not present

## 2020-09-13 LAB — PROTIME-INR
INR: 1 (ref 0.8–1.2)
Prothrombin Time: 13.2 seconds (ref 11.4–15.2)

## 2020-09-13 LAB — COMPREHENSIVE METABOLIC PANEL
ALT: 18 U/L (ref 0–44)
AST: 17 U/L (ref 15–41)
Albumin: 3.9 g/dL (ref 3.5–5.0)
Alkaline Phosphatase: 99 U/L (ref 38–126)
Anion gap: 6 (ref 5–15)
BUN: 19 mg/dL (ref 6–20)
CO2: 28 mmol/L (ref 22–32)
Calcium: 9.4 mg/dL (ref 8.9–10.3)
Chloride: 104 mmol/L (ref 98–111)
Creatinine, Ser: 0.99 mg/dL (ref 0.44–1.00)
GFR, Estimated: >60 mL/min (ref >60)
Glucose, Bld: 120 mg/dL — ABNORMAL HIGH (ref 70–99)
Potassium: 4.7 mmol/L (ref 3.5–5.1)
Sodium: 138 mmol/L (ref 135–145)
Total Bilirubin: 0.5 mg/dL (ref 0.3–1.2)
Total Protein: 6.7 g/dL (ref 6.5–8.1)

## 2020-09-13 LAB — CBC
HCT: 39.5 % (ref 36.0–46.0)
Hemoglobin: 12.2 g/dL (ref 12.0–15.0)
MCH: 28.8 pg (ref 26.0–34.0)
MCHC: 30.9 g/dL (ref 30.0–36.0)
MCV: 93.2 fL (ref 80.0–100.0)
Platelets: 357 10*3/uL (ref 150–400)
RBC: 4.24 MIL/uL (ref 3.87–5.11)
RDW: 13.2 % (ref 11.5–15.5)
WBC: 7.6 10*3/uL (ref 4.0–10.5)
nRBC: 0 % (ref 0.0–0.2)

## 2020-09-13 LAB — SURGICAL PCR SCREEN
MRSA, PCR: NEGATIVE
Staphylococcus aureus: NEGATIVE

## 2020-09-13 LAB — HEMOGLOBIN A1C
Hgb A1c MFr Bld: 5.8 % — ABNORMAL HIGH (ref 4.8–5.6)
Mean Plasma Glucose: 119.76 mg/dL

## 2020-09-13 LAB — TYPE AND SCREEN
ABO/RH(D): A POS
Antibody Screen: NEGATIVE

## 2020-09-13 LAB — GLUCOSE, CAPILLARY: Glucose-Capillary: 106 mg/dL — ABNORMAL HIGH (ref 70–99)

## 2020-09-13 NOTE — Progress Notes (Addendum)
Anesthesia Review:  PCP: DR Gar Ponto LVMM and requested LOV note.  07/24/20 on chart  Cardiologist : none  Chest x-ray : EKG : 09/13/20  Echo : Stress test: Cardiac Cath :  Activity level: can do a flight of stairs without difficulty  Sleep Study/ CPAP : has cpap  Fasting Blood Sugar :      / Checks Blood Sugar -- times a day:   Blood Thinner/ Instructions /Last Dose: ASA / Instructions/ Last Dose :   DM- type 2  Hgba1c-  09/13/20 - 5.8 Phentermine - pt was instructed to stop at least a week prior to surgery  Checks glucose 3 times daily  Covid test- 09/24/20

## 2020-09-14 ENCOUNTER — Encounter (HOSPITAL_COMMUNITY): Payer: Self-pay

## 2020-09-24 ENCOUNTER — Other Ambulatory Visit: Payer: Self-pay | Admitting: Orthopedic Surgery

## 2020-09-25 LAB — SARS CORONAVIRUS 2 (TAT 6-24 HRS): SARS Coronavirus 2: NEGATIVE

## 2020-09-25 NOTE — H&P (Addendum)
TOTAL HIP ADMISSION H&P  Patient is admitted for right total hip arthroplasty.  Subjective:  Chief Complaint: Right hip pain  HPI: Theresa Horton, 60 y.o. female, has a history of pain and functional disability in the right hip due to arthritis and patient has failed non-surgical conservative treatments for greater than 12 weeks to include NSAID's and/or analgesics and activity modification. Onset of symptoms was gradual, starting  several  years ago with gradually worsening course since that time. The patient noted no past surgery on the right hip. Patient currently rates pain in the right hip at 7 out of 10 with activity. Patient has worsening of pain with activity and weight bearing, pain that interfers with activities of daily living, and pain with passive range of motion. Patient has evidence of periarticular osteophytes and joint space narrowing by imaging studies. This condition presents safety issues increasing the risk of falls. There is no current active infection.  Patient Active Problem List   Diagnosis Date Noted   OA (osteoarthritis) of hip 05/11/2013   HYPERCHOLESTEROLEMIA  IIA 11/09/2008   OVERWEIGHT/OBESITY 11/09/2008   HYPERTENSION, UNSPECIFIED 11/09/2008   PALPITATIONS 11/09/2008   ABNORMAL EKG 11/09/2008    Past Medical History:  Diagnosis Date   Anxiety    Arthritis    Cancer ([place])    hxof precancerous cervix- removed    Depression    Diabetes mellitus without complication ([place])    hx of controlled with diet now    Dysrhythmia    palpitations; evaluated by cardiology in 2009; holter monitor showed NSR   GERD (gastroesophageal reflux disease)    Hypertension    hx of but had 100 lb weight loss    PONV (postoperative nausea and vomiting)    Sleep apnea    hx of no longer has to use CPAP     Past Surgical History:  Procedure Laterality Date   BACK SURGERY     x 3- has dropped foot    CARPAL TUNNEL RELEASE     bilateral    cervix conization      KNEE  ARTHROSCOPY     bilateral    OOPHORECTOMY     right    TOTAL HIP ARTHROPLASTY Left 05/11/2013   Procedure: LEFT TOTAL HIP ARTHROPLASTY ANTERIOR APPROACH;  Surgeon: Gearlean Alf, MD;  Location: [facility];  Service: Orthopedics;  Laterality: Left;    Prior to Admission medications   Medication Sig Start Date End Date Taking? Authorizing Provider  ARIPiprazole (ABILIFY) 5 MG tablet Take 5 mg by mouth at bedtime. 08/11/20  Yes [provider]  buPROPion (WELLBUTRIN XL) 300 MG 24 hr tablet Take 300 mg by mouth daily. 07/25/20  Yes [provider]  cholecalciferol (VITAMIN D3) 25 MCG (1000 UNIT) tablet Take 1,000 Units by mouth daily.   Yes [provider]  cyclobenzaprine (FLEXERIL) 10 MG tablet Take 1 tablet (10 mg total) by mouth 3 (three) times daily as needed for muscle spasms. 05/13/13  Yes Perkins, Alexzandrew L, PA-C  docusate sodium (COLACE) 100 MG capsule Take 100 mg by mouth daily.   Yes [provider]  DULoxetine (CYMBALTA) 60 MG capsule Take 60 mg by mouth 2 (two) times daily. Pt takes both at nite per preop apapt 06/11/20  Yes [provider]  gabapentin (NEURONTIN) 600 MG tablet Take 600 mg by mouth 3 (three) times daily as needed (Pain).   Yes [provider]  HYDROcodone-acetaminophen (NORCO) 10-325 MG tablet Take 1 tablet by mouth  4 (four) times daily as needed for pain. 08/11/20  Yes [provider]  losartan (COZAAR) 50 MG tablet Take 50 mg by mouth daily.   Yes [provider]  Magnesium 250 MG TABS Take 250 mg by mouth daily.   Yes [provider]  meloxicam (MOBIC) 15 MG tablet Take 15 mg by mouth daily.   Yes [provider]  Menthol, Topical Analgesic, 3.7 % GEL Apply 1 application topically daily as needed (Muscle Pain).   Yes [provider]  Misc Natural Products ([place] MSM FORMULA) TABS Take 2 tablets by mouth daily.   Yes [provider]  Omega-3 Fatty  Acids (FISH OIL) 1000 MG CAPS Take 1,000 mg by mouth daily.   Yes [provider]  omeprazole (PRILOSEC) 20 MG capsule Take 20 mg by mouth daily as needed (Acid reflux).   Yes [provider]  phentermine (ADIPEX-P) 37.5 MG tablet Take 37.5 mg by mouth daily. 08/22/20  Yes [provider]  Semaglutide, 1 MG/DOSE, (OZEMPIC, 1 MG/DOSE,) 2 MG/1.5ML SOPN Inject 1 mg into the skin every Tuesday.   Yes [provider]  simvastatin (ZOCOR) 20 MG tablet Take 20 mg by mouth daily. 08/01/20  Yes [provider]  vitamin C (ASCORBIC ACID) 500 MG tablet Take 500 mg by mouth daily.   Yes [provider]  ciclopirox (LOPROX) 0.77 % cream Apply topically daily. Pt uses at [facility], Historical, MD    Allergies  Allergen Reactions   Lipitor [Atorvastatin]     Blood pressure dropped    Nsaids Swelling   Penicillins Hives   Sulfa Antibiotics     Mouth had blisters and thrush    Terbinafine And Related     Constipation    Glipizide Rash   Glyburide Rash   Nalfon [Fenoprofen Calcium] Swelling and Rash    Social History   Socioeconomic History   Marital status: Single    Spouse name: Not on file   Number of children: Not on file   Years of education: Not on file   Highest education level: Not on file  Occupational History   Not on file  Tobacco Use   Smoking status: Never   Smokeless tobacco: Never  Vaping Use   Vaping Use: Never used  Substance and Sexual Activity   Alcohol use: Yes    Comment: occasional    Drug use: No   Sexual activity: Not on file  Other Topics Concern   Not on file  Social History Narrative   Not on file   Social Determinants of Health   Financial Resource Strain: Not on file  Food Insecurity: Not on file  Transportation Needs: Not on file  Physical Activity: Not on file  Stress: Not on file  Social Connections: Not on file  Intimate Partner Violence: Not on file    Tobacco Use: Low Risk     Smoking Tobacco Use: Never   Smokeless Tobacco Use: Never   Social History   Substance and Sexual Activity  Alcohol Use Yes   Comment: occasional     No family history on file.  ROS: Constitutional: no fever, no chills, no night sweats, no significant weight loss Cardiovascular: no chest pain, no palpitations Respiratory: no cough, no shortness of breath, No COPD Gastrointestinal: no vomiting, no nausea Musculoskeletal: no swelling in Joints, Joint Pain Neurologic: no numbness, no tingling, no difficulty with balance    Objective:  Physical Exam: Well nourished  and well developed.  General: Alert and oriented x3, cooperative and pleasant, no acute distress.  Head: normocephalic, atraumatic, neck supple.  Eyes: EOMI.  Respiratory: breath sounds clear in all fields, no wheezing, rales, or rhonchi. Cardiovascular: Regular rate and rhythm, no murmurs, gallops or rubs.  Abdomen: non-tender to palpation and soft, normoactive bowel sounds. Musculoskeletal:  Right Hip Exam:   The range of motion: Flexion to 100 degrees, Internal Rotation to 0 degrees, External Rotation to 0 degrees, and abduction to 10 degrees without discomfort.   There is no tenderness over the greater trochanteric bursa.   There is no pain on provocative testing of the hip.   Calves soft and nontender. Motor function intact in LE. Strength 5/5 LE bilaterally. Neuro: Distal pulses 2+. Sensation to light touch intact in LE.    Vital signs in last 24 hours:    Imaging Review Radiographs - AP pelvis, AP and lateral of the right hip dated 05/18/2020 demonstrate severe erosive arthritis. The femoral head is almost completely gone. She has a well-seated left hip arthroplasty.  Assessment/Plan:  End stage arthritis, right hip  The patient history, physical examination, clinical judgement of the provider and imaging studies are consistent with end stage degenerative joint disease of the right hip and total  hip arthroplasty is deemed medically necessary. The treatment options including medical management, injection therapy, arthroscopy and arthroplasty were discussed at length. The risks and benefits of total hip arthroplasty were presented and reviewed. The risks due to aseptic loosening, infection, stiffness, dislocation/subluxation, thromboembolic complications and other imponderables were discussed. The patient acknowledged the explanation, agreed to proceed with the plan and consent was signed. Patient is being admitted for inpatient treatment for surgery, pain control, PT, OT, prophylactic antibiotics, VTE prophylaxis, progressive ambulation and ADLs and discharge planning.The patient is planning to be discharged  home .   Patient's anticipated LOS is less than 2 midnights, meeting these requirements: - Younger than 17 - Lives within 1 hour of care - Has a competent adult at home to recover with post-op - NO history of  - Chronic pain requiring opioids  - Diabetes  - Coronary Artery Disease  - Heart failure  - Heart attack  - Stroke  - DVT/VTE  - Cardiac arrhythmia  - Respiratory Failure/COPD  - Renal failure  - Anemia  - Advanced Liver disease    Therapy Plans: HEP Disposition: Home with Mother Planned DVT Prophylaxis: Xarelto '10mg'$  (NSAID allergy) DME Needed: Vassie Moselle PCP: Gar Ponto, MD TXA: IV Allergies: Glibornuride, Glipizide, Lipitor, PCN, Sulfa, Terbinafine Anesthesia Concerns: N/V after one surgery in the past.  BMI: 41.9 Last HgbA1c: 6.7  Pharmacy: [facility]	[place]   - Patient was instructed on what medications to stop prior to surgery. - Follow-up visit in 2 weeks with Dr. Wynelle Link - Begin physical therapy following surgery - Pre-operative lab work as pre-surgical testing - Prescriptions will be provided in hospital at time of discharge  Fenton Foy, [facility], PA-C Orthopedic Surgery [facility] [place]

## 2020-09-26 ENCOUNTER — Observation Stay (HOSPITAL_COMMUNITY): Payer: Medicare PPO

## 2020-09-26 ENCOUNTER — Observation Stay (HOSPITAL_COMMUNITY)
Admission: RE | Admit: 2020-09-26 | Discharge: 2020-09-27 | Disposition: A | Discharge status: Home / Self Care | Payer: Medicare PPO | Source: Ambulatory Visit | Attending: Orthopedic Surgery | Admitting: Orthopedic Surgery

## 2020-09-26 ENCOUNTER — Ambulatory Visit (HOSPITAL_COMMUNITY): Payer: Medicare PPO | Admitting: Certified Registered"

## 2020-09-26 ENCOUNTER — Encounter (HOSPITAL_COMMUNITY): Payer: Self-pay | Admitting: Orthopedic Surgery

## 2020-09-26 ENCOUNTER — Encounter (HOSPITAL_COMMUNITY)
Admission: RE | Disposition: A | Discharge status: Home / Self Care | Payer: Self-pay | Source: Ambulatory Visit | Attending: Orthopedic Surgery

## 2020-09-26 ENCOUNTER — Other Ambulatory Visit: Payer: Self-pay

## 2020-09-26 ENCOUNTER — Ambulatory Visit (HOSPITAL_COMMUNITY): Payer: Medicare PPO

## 2020-09-26 ENCOUNTER — Ambulatory Visit (HOSPITAL_COMMUNITY): Payer: Medicare PPO | Admitting: Emergency Medicine

## 2020-09-26 DIAGNOSIS — Z471 Aftercare following joint replacement surgery: Secondary | ICD-10-CM | POA: Diagnosis not present

## 2020-09-26 DIAGNOSIS — Z96641 Presence of right artificial hip joint: Secondary | ICD-10-CM | POA: Diagnosis present

## 2020-09-26 DIAGNOSIS — Z79899 Other long term (current) drug therapy: Secondary | ICD-10-CM | POA: Insufficient documentation

## 2020-09-26 DIAGNOSIS — E7800 Pure hypercholesterolemia, unspecified: Secondary | ICD-10-CM | POA: Diagnosis not present

## 2020-09-26 DIAGNOSIS — Z96649 Presence of unspecified artificial hip joint: Secondary | ICD-10-CM

## 2020-09-26 DIAGNOSIS — S72041A Displaced fracture of base of neck of right femur, initial encounter for closed fracture: Secondary | ICD-10-CM

## 2020-09-26 DIAGNOSIS — M1611 Unilateral primary osteoarthritis, right hip: Secondary | ICD-10-CM | POA: Diagnosis not present

## 2020-09-26 DIAGNOSIS — E119 Type 2 diabetes mellitus without complications: Secondary | ICD-10-CM | POA: Diagnosis not present

## 2020-09-26 DIAGNOSIS — Z96642 Presence of left artificial hip joint: Secondary | ICD-10-CM | POA: Diagnosis not present

## 2020-09-26 DIAGNOSIS — E7849 Other hyperlipidemia: Secondary | ICD-10-CM | POA: Diagnosis not present

## 2020-09-26 DIAGNOSIS — I10 Essential (primary) hypertension: Secondary | ICD-10-CM | POA: Diagnosis not present

## 2020-09-26 DIAGNOSIS — K219 Gastro-esophageal reflux disease without esophagitis: Secondary | ICD-10-CM | POA: Diagnosis not present

## 2020-09-26 DIAGNOSIS — E1165 Type 2 diabetes mellitus with hyperglycemia: Secondary | ICD-10-CM | POA: Diagnosis not present

## 2020-09-26 HISTORY — PX: TOTAL HIP ARTHROPLASTY: SHX124

## 2020-09-26 LAB — GLUCOSE, CAPILLARY
Glucose-Capillary: 78 mg/dL (ref 70–99)
Glucose-Capillary: 74 mg/dL (ref 70–99)
Glucose-Capillary: 127 mg/dL — ABNORMAL HIGH (ref 70–99)
Glucose-Capillary: 135 mg/dL — ABNORMAL HIGH (ref 70–99)
Glucose-Capillary: 128 mg/dL — ABNORMAL HIGH (ref 70–99)

## 2020-09-26 SURGERY — ARTHROPLASTY, HIP, TOTAL, ANTERIOR APPROACH
Anesthesia: General | Site: Hip | Laterality: Right

## 2020-09-26 MED ORDER — FENTANYL CITRATE PF 50 MCG/ML IJ SOSY
PREFILLED_SYRINGE | INTRAMUSCULAR | Status: AC
  Filled 2020-09-26: qty 2

## 2020-09-26 MED ORDER — DEXAMETHASONE SODIUM PHOSPHATE 10 MG/ML IJ SOLN
8.0000 mg | Freq: Once | INTRAMUSCULAR | Status: AC
  Administered 2020-09-26: 8 mg via INTRAVENOUS

## 2020-09-26 MED ORDER — HYDROCODONE-ACETAMINOPHEN 5-325 MG PO TABS
1.0000 | ORAL_TABLET | ORAL | Status: DC | PRN
  Administered 2020-09-26 – 2020-09-27 (×4): 2 via ORAL
  Filled 2020-09-26 (×4): qty 2

## 2020-09-26 MED ORDER — FENTANYL CITRATE (PF) 100 MCG/2ML IJ SOLN
INTRAMUSCULAR | Status: DC | PRN
  Administered 2020-09-26: 25 ug via INTRAVENOUS
  Administered 2020-09-26: 50 ug via INTRAVENOUS
  Administered 2020-09-26 (×2): 25 ug via INTRAVENOUS
  Administered 2020-09-26 (×2): 50 ug via INTRAVENOUS
  Administered 2020-09-26: 25 ug via INTRAVENOUS

## 2020-09-26 MED ORDER — TRAMADOL HCL 50 MG PO TABS: 50.0000 mg | ORAL_TABLET | Freq: Four times a day (QID) | ORAL | Status: DC | PRN

## 2020-09-26 MED ORDER — PROPOFOL 10 MG/ML IV BOLUS
INTRAVENOUS | Status: DC | PRN
  Administered 2020-09-26: 200 mg via INTRAVENOUS

## 2020-09-26 MED ORDER — ONDANSETRON HCL 4 MG PO TABS: 4.0000 mg | ORAL_TABLET | Freq: Four times a day (QID) | ORAL | Status: DC | PRN

## 2020-09-26 MED ORDER — METHOCARBAMOL 1000 MG/10ML IJ SOLN
500.0000 mg | Freq: Four times a day (QID) | INTRAVENOUS | Status: DC | PRN
  Filled 2020-09-26 (×2): qty 5

## 2020-09-26 MED ORDER — MENTHOL 3 MG MT LOZG: 1.0000 | LOZENGE | OROMUCOSAL | Status: DC | PRN

## 2020-09-26 MED ORDER — LACTATED RINGERS IV SOLN: INTRAVENOUS | Status: DC

## 2020-09-26 MED ORDER — ACETAMINOPHEN 10 MG/ML IV SOLN
1000.0000 mg | Freq: Four times a day (QID) | INTRAVENOUS | Status: DC
  Administered 2020-09-26: 1000 mg via INTRAVENOUS
  Filled 2020-09-26: qty 100

## 2020-09-26 MED ORDER — FENTANYL CITRATE (PF) 100 MCG/2ML IJ SOLN
INTRAMUSCULAR | Status: AC
  Filled 2020-09-26: qty 2

## 2020-09-26 MED ORDER — SODIUM CHLORIDE 0.9 % IV SOLN: INTRAVENOUS | Status: DC

## 2020-09-26 MED ORDER — EPHEDRINE SULFATE-NACL 50-0.9 MG/10ML-% IV SOSY
PREFILLED_SYRINGE | INTRAVENOUS | Status: DC | PRN
  Administered 2020-09-26: 10 mg via INTRAVENOUS

## 2020-09-26 MED ORDER — ONDANSETRON HCL 4 MG/2ML IJ SOLN
INTRAMUSCULAR | Status: AC
  Filled 2020-09-26: qty 2

## 2020-09-26 MED ORDER — 0.9 % SODIUM CHLORIDE (POUR BTL) OPTIME
TOPICAL | Status: DC | PRN
  Administered 2020-09-26: 1000 mL

## 2020-09-26 MED ORDER — POLYETHYLENE GLYCOL 3350 17 G PO PACK: 17.0000 g | PACK | Freq: Every day | ORAL | Status: DC | PRN

## 2020-09-26 MED ORDER — BUPROPION HCL ER (XL) 300 MG PO TB24
300.0000 mg | ORAL_TABLET | Freq: Every day | ORAL | Status: DC
  Administered 2020-09-27: 300 mg via ORAL
  Filled 2020-09-26: qty 1

## 2020-09-26 MED ORDER — BUPIVACAINE-EPINEPHRINE (PF) 0.25% -1:200000 IJ SOLN
INTRAMUSCULAR | Status: DC | PRN
  Administered 2020-09-26: 30 mL

## 2020-09-26 MED ORDER — METHOCARBAMOL 500 MG PO TABS
500.0000 mg | ORAL_TABLET | Freq: Four times a day (QID) | ORAL | Status: DC | PRN
  Administered 2020-09-26 – 2020-09-27 (×2): 500 mg via ORAL
  Filled 2020-09-26 (×2): qty 1

## 2020-09-26 MED ORDER — ARIPIPRAZOLE 5 MG PO TABS
5.0000 mg | ORAL_TABLET | Freq: Every day | ORAL | Status: DC
  Administered 2020-09-26: 5 mg via ORAL
  Filled 2020-09-26: qty 1

## 2020-09-26 MED ORDER — MORPHINE SULFATE (PF) 2 MG/ML IV SOLN
0.5000 mg | INTRAVENOUS | Status: DC | PRN
  Administered 2020-09-26: 1 mg via INTRAVENOUS
  Filled 2020-09-26: qty 1

## 2020-09-26 MED ORDER — PANTOPRAZOLE SODIUM 40 MG PO TBEC
40.0000 mg | DELAYED_RELEASE_TABLET | Freq: Every day | ORAL | Status: DC
  Administered 2020-09-26 – 2020-09-27 (×2): 40 mg via ORAL
  Filled 2020-09-26 (×2): qty 1

## 2020-09-26 MED ORDER — METOCLOPRAMIDE HCL 5 MG/ML IJ SOLN: 5.0000 mg | Freq: Three times a day (TID) | INTRAMUSCULAR | Status: DC | PRN

## 2020-09-26 MED ORDER — PHENYLEPHRINE 40 MCG/ML (10ML) SYRINGE FOR IV PUSH (FOR BLOOD PRESSURE SUPPORT)
PREFILLED_SYRINGE | INTRAVENOUS | Status: DC | PRN
  Administered 2020-09-26 (×2): 120 ug via INTRAVENOUS

## 2020-09-26 MED ORDER — CHLORHEXIDINE GLUCONATE 0.12 % MT SOLN
15.0000 mL | Freq: Once | OROMUCOSAL | Status: AC
  Administered 2020-09-26: 15 mL via OROMUCOSAL

## 2020-09-26 MED ORDER — DEXAMETHASONE SODIUM PHOSPHATE 10 MG/ML IJ SOLN
INTRAMUSCULAR | Status: AC
  Filled 2020-09-26: qty 1

## 2020-09-26 MED ORDER — DOCUSATE SODIUM 100 MG PO CAPS
100.0000 mg | ORAL_CAPSULE | Freq: Two times a day (BID) | ORAL | Status: DC
  Administered 2020-09-26 – 2020-09-27 (×2): 100 mg via ORAL
  Filled 2020-09-26 (×2): qty 1

## 2020-09-26 MED ORDER — PROPOFOL 10 MG/ML IV BOLUS
INTRAVENOUS | Status: AC
  Filled 2020-09-26: qty 20

## 2020-09-26 MED ORDER — DULOXETINE HCL 60 MG PO CPEP
60.0000 mg | ORAL_CAPSULE | Freq: Two times a day (BID) | ORAL | Status: DC
  Administered 2020-09-26 – 2020-09-27 (×2): 60 mg via ORAL
  Filled 2020-09-26 (×2): qty 1

## 2020-09-26 MED ORDER — CEFAZOLIN SODIUM-DEXTROSE 2-4 GM/100ML-% IV SOLN
2.0000 g | Freq: Four times a day (QID) | INTRAVENOUS | Status: AC
  Administered 2020-09-26 (×2): 2 g via INTRAVENOUS
  Filled 2020-09-26 (×2): qty 100

## 2020-09-26 MED ORDER — ORAL CARE MOUTH RINSE: 15.0000 mL | Freq: Once | OROMUCOSAL | Status: AC

## 2020-09-26 MED ORDER — ACETAMINOPHEN 325 MG PO TABS: 325.0000 mg | ORAL_TABLET | Freq: Four times a day (QID) | ORAL | Status: DC | PRN

## 2020-09-26 MED ORDER — ONDANSETRON HCL 4 MG/2ML IJ SOLN
INTRAMUSCULAR | Status: DC | PRN
  Administered 2020-09-26: 4 mg via INTRAVENOUS

## 2020-09-26 MED ORDER — ONDANSETRON HCL 4 MG/2ML IJ SOLN: 4.0000 mg | Freq: Four times a day (QID) | INTRAMUSCULAR | Status: DC | PRN

## 2020-09-26 MED ORDER — PHENOL 1.4 % MT LIQD: 1.0000 | OROMUCOSAL | Status: DC | PRN

## 2020-09-26 MED ORDER — MIDAZOLAM HCL 2 MG/2ML IJ SOLN
INTRAMUSCULAR | Status: AC
  Filled 2020-09-26: qty 2

## 2020-09-26 MED ORDER — CEFAZOLIN SODIUM-DEXTROSE 2-4 GM/100ML-% IV SOLN
2.0000 g | INTRAVENOUS | Status: AC
Start: 2020-09-26 — End: 2020-09-26
  Administered 2020-09-26: 2 g via INTRAVENOUS
  Filled 2020-09-26: qty 100

## 2020-09-26 MED ORDER — EPHEDRINE 5 MG/ML INJ
INTRAVENOUS | Status: AC
  Filled 2020-09-26: qty 5

## 2020-09-26 MED ORDER — BUPIVACAINE-EPINEPHRINE (PF) 0.25% -1:200000 IJ SOLN
INTRAMUSCULAR | Status: AC
  Filled 2020-09-26: qty 30

## 2020-09-26 MED ORDER — LIDOCAINE 2% (20 MG/ML) 5 ML SYRINGE
INTRAMUSCULAR | Status: AC
  Filled 2020-09-26: qty 5

## 2020-09-26 MED ORDER — PROPOFOL 1000 MG/100ML IV EMUL
INTRAVENOUS | Status: AC
  Filled 2020-09-26: qty 100

## 2020-09-26 MED ORDER — LIDOCAINE 2% (20 MG/ML) 5 ML SYRINGE
INTRAMUSCULAR | Status: DC | PRN
  Administered 2020-09-26: 60 mg via INTRAVENOUS

## 2020-09-26 MED ORDER — BISACODYL 10 MG RE SUPP: 10.0000 mg | Freq: Every day | RECTAL | Status: DC | PRN

## 2020-09-26 MED ORDER — GABAPENTIN 300 MG PO CAPS: 600.0000 mg | ORAL_CAPSULE | Freq: Three times a day (TID) | ORAL | Status: DC | PRN

## 2020-09-26 MED ORDER — CICLOPIROX OLAMINE 0.77 % EX CREA: TOPICAL_CREAM | Freq: Every day | CUTANEOUS | Status: DC

## 2020-09-26 MED ORDER — METOCLOPRAMIDE HCL 5 MG PO TABS: 5.0000 mg | ORAL_TABLET | Freq: Three times a day (TID) | ORAL | Status: DC | PRN

## 2020-09-26 MED ORDER — INSULIN ASPART 100 UNIT/ML IJ SOLN
0.0000 [IU] | Freq: Three times a day (TID) | INTRAMUSCULAR | Status: DC
  Administered 2020-09-26 – 2020-09-27 (×2): 2 [IU] via SUBCUTANEOUS

## 2020-09-26 MED ORDER — MIDAZOLAM HCL 2 MG/2ML IJ SOLN
INTRAMUSCULAR | Status: DC | PRN
  Administered 2020-09-26: 2 mg via INTRAVENOUS

## 2020-09-26 MED ORDER — AMISULPRIDE (ANTIEMETIC) 5 MG/2ML IV SOLN: 10.0000 mg | Freq: Once | INTRAVENOUS | Status: DC | PRN

## 2020-09-26 MED ORDER — TRANEXAMIC ACID-NACL 1000-0.7 MG/100ML-% IV SOLN
1000.0000 mg | INTRAVENOUS | Status: AC
  Administered 2020-09-26: 1000 mg via INTRAVENOUS
  Filled 2020-09-26: qty 100

## 2020-09-26 MED ORDER — POVIDONE-IODINE 10 % EX SWAB
2.0000 "application " | Freq: Once | CUTANEOUS | Status: AC
  Administered 2020-09-26: 2 via TOPICAL

## 2020-09-26 MED ORDER — RIVAROXABAN 10 MG PO TABS
10.0000 mg | ORAL_TABLET | Freq: Every day | ORAL | Status: DC
  Administered 2020-09-27: 10 mg via ORAL
  Filled 2020-09-26: qty 1

## 2020-09-26 MED ORDER — LOSARTAN POTASSIUM 50 MG PO TABS
50.0000 mg | ORAL_TABLET | Freq: Every day | ORAL | Status: DC
  Administered 2020-09-27: 50 mg via ORAL
  Filled 2020-09-26: qty 1

## 2020-09-26 MED ORDER — FENTANYL CITRATE PF 50 MCG/ML IJ SOSY
25.0000 ug | PREFILLED_SYRINGE | INTRAMUSCULAR | Status: DC | PRN
  Administered 2020-09-26 (×2): 50 ug via INTRAVENOUS

## 2020-09-26 MED ORDER — PHENYLEPHRINE 40 MCG/ML (10ML) SYRINGE FOR IV PUSH (FOR BLOOD PRESSURE SUPPORT)
PREFILLED_SYRINGE | INTRAVENOUS | Status: AC
  Filled 2020-09-26: qty 10

## 2020-09-26 MED ORDER — SIMVASTATIN 20 MG PO TABS
20.0000 mg | ORAL_TABLET | Freq: Every day | ORAL | Status: DC
  Administered 2020-09-26: 20 mg via ORAL
  Filled 2020-09-26: qty 1

## 2020-09-26 MED ORDER — WATER FOR IRRIGATION, STERILE IR SOLN
Status: DC | PRN
  Administered 2020-09-26: 2000 mL

## 2020-09-26 SURGICAL SUPPLY — 45 items
BAG COUNTER SPONGE SURGICOUNT (BAG) IMPLANT
BAG DECANTER FOR FLEXI CONT (MISCELLANEOUS) IMPLANT
BAG SPEC THK2 15X12 ZIP CLS (MISCELLANEOUS)
BAG SPNG CNTER NS LX DISP (BAG)
BAG ZIPLOCK 12X15 (MISCELLANEOUS) IMPLANT
BLADE SAG 18X100X1.27 (BLADE) ×2 IMPLANT
CLSR STERI-STRIP ANTIMIC 1/2X4 (GAUZE/BANDAGES/DRESSINGS) ×1 IMPLANT
COVER PERINEAL POST (MISCELLANEOUS) ×2 IMPLANT
COVER SURGICAL LIGHT HANDLE (MISCELLANEOUS) ×2 IMPLANT
CUP ACET PINNACLE SECTR 50MM (Hips) IMPLANT
DECANTER SPIKE VIAL GLASS SM (MISCELLANEOUS) ×2 IMPLANT
DRAPE FOOT SWITCH (DRAPES) ×2 IMPLANT
DRAPE STERI IOBAN 125X83 (DRAPES) ×2 IMPLANT
DRAPE U-SHAPE 47X51 STRL (DRAPES) ×4 IMPLANT
DRSG AQUACEL AG ADV 3.5X10 (GAUZE/BANDAGES/DRESSINGS) ×2 IMPLANT
DURAPREP 26ML APPLICATOR (WOUND CARE) ×2 IMPLANT
ELECT REM PT RETURN 15FT ADLT (MISCELLANEOUS) ×2 IMPLANT
GLOVE SRG 8 PF TXTR STRL LF DI (GLOVE) ×1 IMPLANT
GLOVE SURG ENC MOIS LTX SZ6.5 (GLOVE) ×2 IMPLANT
GLOVE SURG ENC MOIS LTX SZ7 (GLOVE) ×2 IMPLANT
GLOVE SURG ENC MOIS LTX SZ8 (GLOVE) ×4 IMPLANT
GLOVE SURG UNDER POLY LF SZ7 (GLOVE) ×2 IMPLANT
GLOVE SURG UNDER POLY LF SZ8 (GLOVE) ×2
GLOVE SURG UNDER POLY LF SZ8.5 (GLOVE) IMPLANT
GOWN STRL REUS W/TWL LRG LVL3 (GOWN DISPOSABLE) ×4 IMPLANT
GOWN STRL REUS W/TWL XL LVL3 (GOWN DISPOSABLE) IMPLANT
HEAD FEM STD 32X+13 STRL (Hips) ×1 IMPLANT
HOLDER FOLEY CATH W/STRAP (MISCELLANEOUS) ×2 IMPLANT
KIT TURNOVER KIT A (KITS) ×2 IMPLANT
LINER MARATHON 32 50 (Hips) ×1 IMPLANT
MANIFOLD NEPTUNE II (INSTRUMENTS) ×2 IMPLANT
PACK ANTERIOR HIP CUSTOM (KITS) ×2 IMPLANT
PENCIL SMOKE EVACUATOR COATED (MISCELLANEOUS) ×2 IMPLANT
PINNACLE SECTOR CUP 50MM (Hips) ×2 IMPLANT
STEM FEM ACTIS STD SZ1 (Stem) ×1 IMPLANT
STRIP CLOSURE SKIN 1/2X4 (GAUZE/BANDAGES/DRESSINGS) ×2 IMPLANT
SUT ETHIBOND NAB CT1 #1 30IN (SUTURE) ×2 IMPLANT
SUT MNCRL AB 4-0 PS2 18 (SUTURE) ×2 IMPLANT
SUT STRATAFIX 0 PDS 27 VIOLET (SUTURE) ×4
SUT VIC AB 2-0 CT1 27 (SUTURE) ×4
SUT VIC AB 2-0 CT1 TAPERPNT 27 (SUTURE) ×2 IMPLANT
SUTURE STRATFX 0 PDS 27 VIOLET (SUTURE) ×1 IMPLANT
SYR 50ML LL SCALE MARK (SYRINGE) IMPLANT
TRAY FOLEY MTR SLVR 16FR STAT (SET/KITS/TRAYS/PACK) ×2 IMPLANT
TUBE SUCTION HIGH CAP CLEAR NV (SUCTIONS) ×2 IMPLANT

## 2020-09-26 NOTE — Evaluation (Signed)
Physical Therapy Evaluation Patient Details Name: Theresa Horton MRN: [id number] DOB: Apr 29, 1960 Today's Date: 09/26/2020   History of Present Illness  Pt is a 60 y.o. female s/p Rt THA anterior approach on 09/26/20. PMH significant for anxiety, depression, DM, HTN, OA, Back surgery (x3, has drop foot), Lt THA (2015).  Clinical Impression  Pt is POD 0 s/p Rt THA resulting in the deficits listed below (see PT Problem List). Pt performed sit to stand transfers with MIN assist for stability and cues for safe hand placement. Pt reported onset of lightheadedness in standing and was assisted back to sitting EOB. BP 138/92, HR 89bpm. Symptoms improved with seated rest. Stand step transfer from EOB to recliner performed, pt reported feeling shaky, observed mild UE tremoring/shaking during transfer, resolution of symptoms while seated in recliner with legs elevated. PT reviewed therapeutic exercise for promotion of improved circulation/DVT prevention, pt demonstrated understanding. Pt will have assist from family members upon d/c .Pt will benefit from skilled PT to maximize functional mobility to increase independence.      Follow Up Recommendations Follow surgeon's recommendation for DC plan and follow-up therapies    Equipment Recommendations  Rolling walker with 5" wheels    Recommendations for Other Services       Precautions / Restrictions Precautions Precautions: Fall Restrictions Weight Bearing Restrictions: No Other Position/Activity Restrictions: WBAT      Mobility  Bed Mobility Overal bed mobility: Modified Independent             General bed mobility comments: increased time to perform, pt with intermittent use of UE to progress Rt LE to EOB. Pt reported feeling lightheaded which improved with seated rest.    Transfers Overall transfer level: Needs assistance Equipment used: Rolling walker (2 wheeled) Transfers: Sit to/from Stand Sit to Stand: Min assist          General transfer comment: x2 from EOB. MIN A provided for stability with cues for safe hand placement. Pt was able to perform standing marches and lateral side steps with MIN A from therapist for stability. Pt reported onset of lightheadedness in standing assisted back to sitting EOB. BP 138/92, HR 89bpm. Symptoms improved with seated rest. Stand step transfer from EOB to recliner performed, pt reported feeling shaky, observed mild UE tremoring/shaking during transfer, pt reported that shehas not slept since coming out of surgery, symptom resolution while sitting in chair with legs elevated.  Ambulation/Gait                Stairs            Wheelchair Mobility    Modified Rankin (Stroke Patients Only)       Balance Overall balance assessment: Needs assistance Sitting-balance support: Feet supported Sitting balance-Leahy Scale: Good     Standing balance support: Bilateral upper extremity supported;During functional activity Standing balance-Leahy Scale: Poor Standing balance comment: use of external support                             Pertinent Vitals/Pain Pain Assessment: Faces Faces Pain Scale: Hurts little more Pain Location: Rt hip Pain Descriptors / Indicators: Burning;Sore Pain Intervention(s): Limited activity within patient's tolerance;Monitored during session;Repositioned;Premedicated before session    [place] expects to be discharged to:: Private residence Living Arrangements: Parent Available Help at Discharge: Family Type of Home: House Home Access: Ramped entrance;Stairs to enter Entrance Stairs-Rails: Left Entrance Stairs-Number of Steps: 3 (Would like to do  stair training, but has access to ramp if needed) Home Layout: Two level;Able to live on main level with bedroom/bathroom;Full bath on main level Home Equipment: Shower seat;Cane - single point;Grab bars - tub/shower;Grab bars - toilet Additional Comments: Pt lives  with her mother, reports her mom is independent with ADLs. Will have assist from her son and grandson. Son can provide 24/7 supervision if needed.    Prior Function Level of Independence: Independent with assistive device(s)         Comments: use of cane     Hand Dominance        Extremity/Trunk Assessment   Upper Extremity Assessment Upper Extremity Assessment: Overall WFL for tasks assessed    Lower Extremity Assessment Lower Extremity Assessment: RLE deficits/detail RLE Deficits / Details: fair quad set strength, 4+/5 DF/PF    Cervical / Trunk Assessment Cervical / Trunk Assessment: Normal  Communication   Communication: No difficulties  Cognition Arousal/Alertness: Awake/alert Behavior During Therapy: WFL for tasks assessed/performed Overall Cognitive Status: Within Functional Limits for tasks assessed                                        General Comments      Exercises Total Joint Exercises Ankle Circles/Pumps: AROM;Both;10 reps;Seated   Assessment/Plan    PT Assessment Patient needs continued PT services  PT Problem List Decreased strength;Decreased range of motion;Decreased activity tolerance;Decreased balance;Decreased mobility;Decreased knowledge of use of DME;Pain       PT Treatment Interventions DME instruction;Gait training;Stair training;Functional mobility training;Therapeutic activities;Therapeutic exercise;Balance training;Patient/family education    PT Goals (Current goals can be found in the Care Plan section)  Acute Rehab PT Goals Patient Stated Goal: Go home tomorrow PT Goal Formulation: With patient/family Time For Goal Achievement: 10/10/20 Potential to Achieve Goals: Good    Frequency 7X/week   Barriers to discharge        Co-evaluation               AM-PAC PT "6 Clicks" Mobility  Outcome Measure Help needed turning from your back to your side while in a flat bed without using bedrails?: None Help  needed moving from lying on your back to sitting on the side of a flat bed without using bedrails?: None Help needed moving to and from a bed to a chair (including a wheelchair)?: A Little Help needed standing up from a chair using your arms (e.g., wheelchair or bedside chair)?: A Little Help needed to walk in hospital room?: A Little Help needed climbing 3-5 steps with a railing? : A Lot 6 Click Score: 19    End of Session Equipment Utilized During Treatment: Gait belt Activity Tolerance: Patient limited by fatigue;Patient limited by lethargy Patient left: in chair;with call bell/phone within reach;with chair alarm set Nurse Communication: Mobility status PT Visit Diagnosis: Unsteadiness on feet (R26.81);Muscle weakness (generalized) (M62.81);Pain Pain - Right/Left: Right Pain - part of body: Hip    Time: [id number] PT Time Calculation (min) (ACUTE ONLY): 27 min   Charges:   PT Evaluation $PT Eval Low Complexity: 1 Low PT Treatments $Therapeutic Activity: 8-22 mins        Festus Barren PT, DPT  Acute Rehabilitation Services  Office [phone]  09/26/2020, 7:51 PM

## 2020-09-26 NOTE — Care Plan (Signed)
Ortho Bundle Case Management Note  Patient Details  Name: Theresa Horton MRN: [id number] Date of Birth: 08/25/1960                  R THA on 09-26-20 DCP: Home with mother. 2 story home with ramp entrance DME: RW ordered through [place]. Has elevated toilets. PT: HEP   DME Arranged:  Walker rolling DME Agency:  [employer]  HH Arranged:    [place] Agency:     Additional Comments: Please contact me with any questions of if this plan should need to change.  Marianne Sofia, RN,CCM [facility]  [phone] 09/26/2020, 12:32 PM

## 2020-09-26 NOTE — Anesthesia Procedure Notes (Signed)
Procedure Name: LMA Insertion Date/Time: 09/26/2020 10:57 AM Performed by: Niel Hummer, CRNA Pre-anesthesia Checklist: Patient identified and Emergency Drugs available Patient Re-evaluated:Patient Re-evaluated prior to induction Oxygen Delivery Method: Circle system utilized Preoxygenation: Pre-oxygenation with 100% oxygen Induction Type: IV induction LMA: LMA inserted LMA Size: 4.0 Number of attempts: 1 Dental Injury: Teeth and Oropharynx as per pre-operative assessment

## 2020-09-26 NOTE — Discharge Instructions (Addendum)
Frank Aluisio, MD Total Joint Specialist [facility] [address] [place], [place] [address] [phone]  ANTERIOR APPROACH TOTAL HIP REPLACEMENT POSTOPERATIVE DIRECTIONS     Hip Rehabilitation, Guidelines Following Surgery  The results of a hip operation are greatly improved after range of motion and muscle strengthening exercises. Follow all safety measures which are given to protect your hip. If any of these exercises cause increased pain or swelling in your joint, decrease the amount until you are comfortable again. Then slowly increase the exercises. Call your caregiver if you have problems or questions.   BLOOD CLOT PREVENTION Take a 10 mg Xarelto once a day for three weeks following surgery. Then take an 81 mg Aspirin once a day for three weeks. Then discontinue Aspirin. You may resume your vitamins/supplements once you have discontinued the Xarelto. Do not take any NSAIDs (Advil, Aleve, Ibuprofen, Meloxicam, etc.) until you have discontinued the Xarelto.   HOME CARE INSTRUCTIONS  Remove items at home which could result in a fall. This includes throw rugs or furniture in walking pathways.  ICE to the affected hip as frequently as 20-30 minutes an hour and then as needed for pain and swelling. Continue to use ice on the hip for pain and swelling from surgery. You may notice swelling that will progress down to the foot and ankle. This is normal after surgery. Elevate the leg when you are not up walking on it.   Continue to use the breathing machine which will help keep your temperature down.  It is common for your temperature to cycle up and down following surgery, especially at night when you are not up moving around and exerting yourself.  The breathing machine keeps your lungs expanded and your temperature down.  DIET You may resume your previous home diet once your are discharged from the hospital.  DRESSING / WOUND CARE / SHOWERING You have an  adhesive waterproof bandage over the incision. Leave this in place until your first follow-up appointment. Once you remove this you will not need to place another bandage.  You may begin showering 3 days following surgery, but do not submerge the incision under water.  ACTIVITY For the first 3-5 days, it is important to rest and keep the operative leg elevated. You should, as a general rule, rest for 50 minutes and walk/stretch for 10 minutes per hour. After 5 days, you may slowly increase activity as tolerated.  Perform the exercises you were provided twice a day for about 15-20 minutes each session. Begin these 2 days following surgery. Walk with your walker as instructed. Use the walker until you are comfortable transitioning to a cane. Walk with the cane in the opposite hand of the operative leg. You may discontinue the cane once you are comfortable and walking steadily. Avoid periods of inactivity such as sitting longer than an hour when not asleep. This helps prevent blood clots.  Do not drive a car for 6 weeks or until released by your surgeon.  Do not drive while taking narcotics.  TED HOSE STOCKINGS Wear the elastic stockings on both legs for three weeks following surgery during the day. You may remove them at night while sleeping.  WEIGHT BEARING Weight bearing as tolerated with assist device (walker, cane, etc) as directed, use it as long as suggested by your surgeon or therapist, typically at least 4-6 weeks.  POSTOPERATIVE CONSTIPATION PROTOCOL Constipation - defined medically as fewer than three stools per week and severe constipation as less   than one stool per week.  One of the most common issues patients have following surgery is constipation.  Even if you have a regular bowel pattern at home, your normal regimen is likely to be disrupted due to multiple reasons following surgery.  Combination of anesthesia, postoperative narcotics, change in appetite and fluid intake all can  affect your bowels.  In order to avoid complications following surgery, here are some recommendations in order to help you during your recovery period.  Colace (docusate) - Pick up an over-the-counter form of Colace or another stool softener and take twice a day as long as you are requiring postoperative pain medications.  Take with a full glass of water daily.  If you experience loose stools or diarrhea, hold the colace until you stool forms back up.  If your symptoms do not get better within 1 week or if they get worse, check with your doctor. Dulcolax (bisacodyl) - Pick up over-the-counter and take as directed by the product packaging as needed to assist with the movement of your bowels.  Take with a full glass of water.  Use this product as needed if not relieved by Colace only.  MiraLax (polyethylene glycol) - Pick up over-the-counter to have on hand.  MiraLax is a solution that will increase the amount of water in your bowels to assist with bowel movements.  Take as directed and can mix with a glass of water, juice, soda, coffee, or tea.  Take if you go more than two days without a movement.Do not use MiraLax more than once per day. Call your doctor if you are still constipated or irregular after using this medication for 7 days in a row.  If you continue to have problems with postoperative constipation, please contact the office for further assistance and recommendations.  If you experience "the worst abdominal pain ever" or develop nausea or vomiting, please contact the office immediatly for further recommendations for treatment.  ITCHING  If you experience itching with your medications, try taking only a single pain pill, or even half a pain pill at a time.  You can also use Benadryl over the counter for itching or also to help with sleep.   MEDICATIONS See your medication summary on the "After Visit Summary" that the nursing staff will review with you prior to discharge.  You may have some home  medications which will be placed on hold until you complete the course of blood thinner medication.  It is important for you to complete the blood thinner medication as prescribed by your surgeon.  Continue your approved medications as instructed at time of discharge.  PRECAUTIONS If you experience chest pain or shortness of breath - call 911 immediately for transfer to the hospital emergency department.  If you develop a fever greater that 101 F, purulent drainage from wound, increased redness or drainage from wound, foul odor from the wound/dressing, or calf pain - CONTACT YOUR SURGEON.                                                   FOLLOW-UP APPOINTMENTS Make sure you keep all of your appointments after your operation with your surgeon and caregivers. You should call the office at the above phone number and make an appointment for approximately two weeks after the date of your surgery or on the date  instructed by your surgeon outlined in the "After Visit Summary".  RANGE OF MOTION AND STRENGTHENING EXERCISES  These exercises are designed to help you keep full movement of your hip joint. Follow your caregiver's or physical therapist's instructions. Perform all exercises about fifteen times, three times per day or as directed. Exercise both hips, even if you have had only one joint replacement. These exercises can be done on a training (exercise) mat, on the floor, on a table or on a bed. Use whatever works the best and is most comfortable for you. Use music or television while you are exercising so that the exercises are a pleasant break in your day. This will make your life better with the exercises acting as a break in routine you can look forward to.  Lying on your back, slowly slide your foot toward your buttocks, raising your knee up off the floor. Then slowly slide your foot back down until your leg is straight again.  Lying on your back spread your legs as far apart as you can without causing  discomfort.  Lying on your side, raise your upper leg and foot straight up from the floor as far as is comfortable. Slowly lower the leg and repeat.  Lying on your back, tighten up the muscle in the front of your thigh (quadriceps muscles). You can do this by keeping your leg straight and trying to raise your heel off the floor. This helps strengthen the largest muscle supporting your knee.  Lying on your back, tighten up the muscles of your buttocks both with the legs straight and with the knee bent at a comfortable angle while keeping your heel on the floor.   POST-OPERATIVE OPIOID TAPER INSTRUCTIONS: It is important to wean off of your opioid medication as soon as possible. If you do not need pain medication after your surgery it is ok to stop day one. Opioids include: Codeine, Hydrocodone(Norco, Vicodin), Oxycodone(Percocet, oxycontin) and hydromorphone amongst others.  Long term and even short term use of opiods can cause: Increased pain response Dependence Constipation Depression Respiratory depression And more.  Withdrawal symptoms can include Flu like symptoms Nausea, vomiting And more Techniques to manage these symptoms Hydrate well Eat regular healthy meals Stay active Use relaxation techniques(deep breathing, meditating, yoga) Do Not substitute Alcohol to help with tapering If you have been on opioids for less than two weeks and do not have pain than it is ok to stop all together.  Plan to wean off of opioids This plan should start within one week post op of your joint replacement. Maintain the same interval or time between taking each dose and first decrease the dose.  Cut the total daily intake of opioids by one tablet each day Next start to increase the time between doses. The last dose that should be eliminated is the evening dose.   IF YOU ARE TRANSFERRED TO A SKILLED REHAB FACILITY If the patient is transferred to a skilled rehab facility following release from the  hospital, a list of the current medications will be sent to the facility for the patient to continue.  When discharged from the skilled rehab facility, please have the facility set up the patient's Home Health Physical Therapy prior to being released. Also, the skilled facility will be responsible for providing the patient with their medications at time of release from the facility to include their pain medication, the muscle relaxants, and their blood thinner medication. If the patient is still at the rehab facility at   time of the two week follow up appointment, the skilled rehab facility will also need to assist the patient in arranging follow up appointment in our office and any transportation needs.  MAKE SURE YOU:  Understand these instructions.  Get help right away if you are not doing well or get worse.    DENTAL ANTIBIOTICS:  In most cases prophylactic antibiotics for Dental procdeures after total joint surgery are not necessary.  Exceptions are as follows:  1. History of prior total joint infection  2. Severely immunocompromised (Organ Transplant, cancer chemotherapy, Rheumatoid biologic meds such as Humera)  3. Poorly controlled diabetes (A1C &gt; 8.0, blood glucose over 200)  If you have one of these conditions, contact your surgeon for an antibiotic prescription, prior to your dental procedure.    Pick up stool softner and laxative for home use following surgery while on pain medications. Do not submerge incision under water. Please use good hand washing techniques while changing dressing each day. May shower starting three days after surgery. Please use a clean towel to pat the incision dry following showers. Continue to use ice for pain and swelling after surgery. Do not use any lotions or creams on the incision until instructed by your surgeon.      Information on my medicine - XARELTO (Rivaroxaban)  Why was Xarelto prescribed for you? Xarelto was prescribed  for you to reduce the risk of blood clots forming after orthopedic surgery. The medical term for these abnormal blood clots is venous thromboembolism (VTE).  What do you need to know about xarelto ? Take your Xarelto ONCE DAILY at the same time every day. You may take it either with or without food.  If you have difficulty swallowing the tablet whole, you may crush it and mix in applesauce just prior to taking your dose.  Take Xarelto exactly as prescribed by your doctor and DO NOT stop taking Xarelto without talking to the doctor who prescribed the medication.  Stopping without other VTE prevention medication to take the place of Xarelto may increase your risk of developing a clot.  After discharge, you should have regular check-up appointments with your healthcare provider that is prescribing your Xarelto.    What do you do if you miss a dose? If you miss a dose, take it as soon as you remember on the same day then continue your regularly scheduled once daily regimen the next day. Do not take two doses of Xarelto on the same day.   Important Safety Information A possible side effect of Xarelto is bleeding. You should call your healthcare provider right away if you experience any of the following: Bleeding from an injury or your nose that does not stop. Unusual colored urine (red or dark brown) or unusual colored stools (red or black). Unusual bruising for unknown reasons. A serious fall or if you hit your head (even if there is no bleeding).  Some medicines may interact with Xarelto and might increase your risk of bleeding while on Xarelto. To help avoid this, consult your healthcare provider or pharmacist prior to using any new prescription or non-prescription medications, including herbals, vitamins, non-steroidal anti-inflammatory drugs (NSAIDs) and supplements.  This website has more information on Xarelto: www.xarelto.com.   

## 2020-09-26 NOTE — Interval H&P Note (Signed)
History and Physical Interval Note:  09/26/2020 9:47 AM  Theresa Horton  has presented today for surgery, with the diagnosis of right hip osteoarthritis.  The various methods of treatment have been discussed with the patient and family. After consideration of risks, benefits and other options for treatment, the patient has consented to  Procedure(s) with comments: [place] (Right) - 180mn as a surgical intervention.  The patient's history has been reviewed, patient examined, no change in status, stable for surgery.  I have reviewed the patient's chart and labs.  Questions were answered to the patient's satisfaction.     FPilar PlateAluisio

## 2020-09-26 NOTE — Anesthesia Preprocedure Evaluation (Addendum)
Anesthesia Evaluation  Patient identified by MRN, date of birth, ID band Patient awake    Reviewed: Allergy & Precautions, NPO status , Patient's Chart, lab work & pertinent test results  History of Anesthesia Complications (+) PONV  Airway Mallampati: II  TM Distance: >3 FB Neck ROM: Full    Dental   Pulmonary sleep apnea ,    breath sounds clear to auscultation       Cardiovascular hypertension, Pt. on medications  Rhythm:Regular Rate:Normal     Neuro/Psych  Neuromuscular disease    GI/Hepatic Neg liver ROS, GERD  ,  Endo/Other  diabetes, Type 2Morbid obesity  Renal/GU negative Renal ROS     Musculoskeletal  (+) Arthritis ,   Abdominal   Peds  Hematology negative hematology ROS (+)   Anesthesia Other Findings   Reproductive/Obstetrics                             Lab Results  Component Value Date   WBC 7.6 09/13/2020   HGB 12.2 09/13/2020   HCT 39.5 09/13/2020   MCV 93.2 09/13/2020   PLT 357 09/13/2020   Lab Results  Component Value Date   CREATININE 0.99 09/13/2020   BUN 19 09/13/2020   NA 138 09/13/2020   K 4.7 09/13/2020   CL 104 09/13/2020   CO2 28 09/13/2020    Anesthesia Physical Anesthesia Plan  ASA: 3  Anesthesia Plan: General   Post-op Pain Management:    Induction: Intravenous  PONV Risk Score and Plan: 4 or greater and Dexamethasone, Ondansetron and Treatment may vary due to age or medical condition  Airway Management Planned: LMA  Additional Equipment:   Intra-op Plan:   Post-operative Plan: Extubation in OR  Informed Consent: I have reviewed the patients History and Physical, chart, labs and discussed the procedure including the risks, benefits and alternatives for the proposed anesthesia with the patient or authorized representative who has indicated his/her understanding and acceptance.     Dental advisory given  Plan Discussed with:  CRNA  Anesthesia Plan Comments: (Pt declines SAB 2/2 previous back surgery and nerve injury with foot drop. Plan GA.)      Anesthesia Quick Evaluation

## 2020-09-26 NOTE — Op Note (Signed)
OPERATIVE REPORT- TOTAL HIP ARTHROPLASTY   PREOPERATIVE DIAGNOSIS: Osteoarthritis of the Right hip.   POSTOPERATIVE DIAGNOSIS: Osteoarthritis of the Right  hip.   PROCEDURE: Right total hip arthroplasty, anterior approach.   SURGEON: Gaynelle Arabian, MD   ASSISTANT: Theresa Duty, PA-C  ANESTHESIA:  General  ESTIMATED BLOOD LOSS:-450 mL    DRAINS: Hemovac x1.   COMPLICATIONS: None   CONDITION: PACU - hemodynamically stable.   BRIEF CLINICAL NOTE: Theresa Horton is a 60 y.o. female who has advanced end-  stage arthritis of their Right  hip with progressively worsening pain and  dysfunction.The patient has failed nonoperative management and presents for  total hip arthroplasty.   PROCEDURE IN DETAIL: After successful administration of spinal  anesthetic, the traction boots for the [facility] bed were placed on both  feet and the patient was placed onto the [facility] bed, boots placed into the leg  holders. The Right hip was then isolated from the perineum with plastic  drapes and prepped and draped in the usual sterile fashion. ASIS and  greater trochanter were marked and a oblique incision was made, starting  at about 1 cm lateral and 2 cm distal to the ASIS and coursing towards  the anterior cortex of the femur. The skin was cut with a 10 blade  through subcutaneous tissue to the level of the fascia overlying the  tensor fascia lata muscle. The fascia was then incised in line with the  incision at the junction of the anterior third and posterior 2/3rd. The  muscle was teased off the fascia and then the interval between the TFL  and the rectus was developed. The Hohmann retractor was then placed at  the top of the femoral neck over the capsule. The vessels overlying the  capsule were cauterized and the fat on top of the capsule was removed.  A Hohmann retractor was then placed anterior underneath the rectus  femoris to give exposure to the entire anterior capsule. A T-shaped   capsulotomy was performed. The edges were tagged and the femoral head  was identified.       Osteophytes are removed off the superior acetabulum.  The femoral neck was then cut in situ with an oscillating saw. Traction  was then applied to the left lower extremity utilizing the [facility]  traction. The femoral head was then removed. Retractors were placed  around the acetabulum and then circumferential removal of the labrum was  performed. Osteophytes were also removed. Reaming starts at 45 mm to  medialize and  Increased in 2 mm increments to 49 mm. We reamed in  approximately 40 degrees of abduction, 20 degrees anteversion. A 50 mm  pinnacle acetabular shell was then impacted in anatomic position under  fluoroscopic guidance with excellent purchase. We did not need to place  any additional dome screws. A 32 mm neutral + 4 marathon liner was then  placed into the acetabular shell.       The femoral lift was then placed along the lateral aspect of the femur  just distal to the vastus ridge. The leg was  externally rotated and capsule  was stripped off the inferior aspect of the femoral neck down to the  level of the lesser trochanter, this was done with electrocautery. The femur was lifted after this was performed. The  leg was then placed in an extended and adducted position essentially delivering the femur. We also removed the capsule superiorly and the piriformis from the piriformis  fossa to gain excellent exposure of the  proximal femur. Rongeur was used to remove some cancellous bone to get  into the lateral portion of the proximal femur for placement of the  initial starter reamer. The starter broaches was placed  the starter broach  and was shown to go down the center of the canal. Broaching  with the Actis system was then performed starting at size 0  coursing  Up to size 1. A size 1 had excellent torsional and rotational  and axial stability. The trial standard offset neck was then  placed  with a 32 +13 trial head. The hip was then reduced. We confirmed that  the stem was in the canal both on AP and lateral x-rays. It also has excellent sizing. The hip was reduced with outstanding stability through full extension and full external rotation.. AP pelvis was taken and the leg lengths were measured and found to be equal. Hip was then dislocated again and the femoral head and neck removed. The  femoral broach was removed. Size 1 Actis stem with a standard offset  neck was then impacted into the femur following native anteversion. Has  excellent purchase in the canal. Excellent torsional and rotational and  axial stability. It is confirmed to be in the canal on AP and lateral  fluoroscopic views. The 32 + 13 metal head was placed and the hip  reduced with outstanding stability. Again AP pelvis was taken and it  confirmed that the leg lengths were equal. The wound was then copiously  irrigated with saline solution and the capsule reattached and repaired  with Ethibond suture. 30 ml of .25% Bupivicaine was  injected into the capsule and into the edge of the tensor fascia lata as well as subcutaneous tissue. The fascia overlying the tensor fascia lata was then closed with a running #1 V-Loc. Subcu was closed with interrupted 2-0 Vicryl and subcuticular running 4-0 Monocryl. Incision was cleaned  and dried. Steri-Strips and a bulky sterile dressing applied. The patient was awakened and transported to  recovery in stable condition.        Please note that a surgical assistant was a medical necessity for this procedure to perform it in a safe and expeditious manner. Assistant was necessary to provide appropriate retraction of vital neurovascular structures and to prevent femoral fracture and allow for anatomic placement of the prosthesis.  Gaynelle Arabian, M.D.

## 2020-09-26 NOTE — Anesthesia Postprocedure Evaluation (Signed)
Anesthesia Post Note  Patient: Theresa Horton  Procedure(s) Performed: TOTAL HIP ARTHROPLASTY ANTERIOR APPROACH (Right: Hip)     Patient location during evaluation: PACU Anesthesia Type: General Level of consciousness: awake and alert Pain management: pain level controlled Vital Signs Assessment: post-procedure vital signs reviewed and stable Respiratory status: spontaneous breathing, nonlabored ventilation, respiratory function stable and patient connected to nasal cannula oxygen Cardiovascular status: blood pressure returned to baseline and stable Postop Assessment: no apparent nausea or vomiting Anesthetic complications: no   No notable events documented.  Last Vitals:  Vitals:   09/26/20 1523 09/26/20 1649  BP: (!) 155/80 (!) 157/95  Pulse: 80 80  Resp: 16 16  Temp: (!) 36.4 C 36.5 C  SpO2: 98% 95%    Last Pain:  Vitals:   09/26/20 1609  TempSrc:   PainSc: 2                  Tiajuana Amass

## 2020-09-26 NOTE — Transfer of Care (Signed)
Immediate Anesthesia Transfer of Care Note  Patient: Theresa Horton  Procedure(s) Performed: TOTAL HIP ARTHROPLASTY ANTERIOR APPROACH (Right: Hip)  Patient Location: PACU  Anesthesia Type:General  Level of Consciousness: awake  Airway & Oxygen Therapy: Patient Spontanous Breathing and Patient connected to face mask oxygen  Post-op Assessment: Report given to RN, Post -op Vital signs reviewed and stable and Patient moving all extremities X 4  Post vital signs: Reviewed and stable  Last Vitals:  Vitals Value Taken Time  BP 126/71   Temp    Pulse 70 09/26/20 1334  Resp 16 09/26/20 1334  SpO2 100 % 09/26/20 1334  Vitals shown include unvalidated device data.  Last Pain:  Vitals:   09/26/20 0939  TempSrc: Oral  PainSc:       Patients Stated Pain Goal: 5 ([id number])  Complications: No notable events documented.

## 2020-09-27 ENCOUNTER — Encounter (HOSPITAL_COMMUNITY): Payer: Self-pay | Admitting: Orthopedic Surgery

## 2020-09-27 DIAGNOSIS — E119 Type 2 diabetes mellitus without complications: Secondary | ICD-10-CM | POA: Diagnosis not present

## 2020-09-27 DIAGNOSIS — Z96642 Presence of left artificial hip joint: Secondary | ICD-10-CM | POA: Diagnosis not present

## 2020-09-27 DIAGNOSIS — I10 Essential (primary) hypertension: Secondary | ICD-10-CM | POA: Diagnosis not present

## 2020-09-27 DIAGNOSIS — Z79899 Other long term (current) drug therapy: Secondary | ICD-10-CM | POA: Diagnosis not present

## 2020-09-27 DIAGNOSIS — Z96641 Presence of right artificial hip joint: Secondary | ICD-10-CM | POA: Diagnosis not present

## 2020-09-27 DIAGNOSIS — M1611 Unilateral primary osteoarthritis, right hip: Secondary | ICD-10-CM | POA: Diagnosis not present

## 2020-09-27 LAB — BASIC METABOLIC PANEL
Anion gap: 4 — ABNORMAL LOW (ref 5–15)
BUN: 20 mg/dL (ref 6–20)
CO2: 29 mmol/L (ref 22–32)
Calcium: 9.2 mg/dL (ref 8.9–10.3)
Chloride: 108 mmol/L (ref 98–111)
Creatinine, Ser: 0.79 mg/dL (ref 0.44–1.00)
GFR, Estimated: >60 mL/min (ref >60)
Glucose, Bld: 143 mg/dL — ABNORMAL HIGH (ref 70–99)
Potassium: 5.2 mmol/L — ABNORMAL HIGH (ref 3.5–5.1)
Sodium: 141 mmol/L (ref 135–145)

## 2020-09-27 LAB — CBC
HCT: 31.9 % — ABNORMAL LOW (ref 36.0–46.0)
Hemoglobin: 10.3 g/dL — ABNORMAL LOW (ref 12.0–15.0)
MCH: 28.9 pg (ref 26.0–34.0)
MCHC: 32.3 g/dL (ref 30.0–36.0)
MCV: 89.4 fL (ref 80.0–100.0)
Platelets: 317 10*3/uL (ref 150–400)
RBC: 3.57 MIL/uL — ABNORMAL LOW (ref 3.87–5.11)
RDW: 13 % (ref 11.5–15.5)
WBC: 12 10*3/uL — ABNORMAL HIGH (ref 4.0–10.5)
nRBC: 0 % (ref 0.0–0.2)

## 2020-09-27 LAB — GLUCOSE, CAPILLARY
Glucose-Capillary: 109 mg/dL — ABNORMAL HIGH (ref 70–99)
Glucose-Capillary: 127 mg/dL — ABNORMAL HIGH (ref 70–99)

## 2020-09-27 MED ORDER — METHOCARBAMOL 500 MG PO TABS: 500.0000 mg | ORAL_TABLET | Freq: Four times a day (QID) | ORAL | 0 refills | Status: AC | PRN

## 2020-09-27 MED ORDER — HYDROCODONE-ACETAMINOPHEN 5-325 MG PO TABS: 1.0000 | ORAL_TABLET | Freq: Four times a day (QID) | ORAL | 0 refills | Status: AC | PRN

## 2020-09-27 MED ORDER — RIVAROXABAN 10 MG PO TABS: 10.0000 mg | ORAL_TABLET | Freq: Every day | ORAL | 0 refills | Status: AC

## 2020-09-27 NOTE — Progress Notes (Signed)
Discharge package printed and instructions given to patient. Verbalizes understanding. No question.

## 2020-09-27 NOTE — TOC Transition Note (Signed)
Transition of Care [facility]) - CM/SW Discharge Note   Patient Details  Name: Theresa Horton MRN: [id number] Date of Birth: 03/20/1960  [facility]) CM/SW Contact:  Lennart Pall, LCSW Phone Number: 09/27/2020, 10:25 AM   Clinical Narrative:    Met with pt and confirming receipt of rw via [place].  Plan for HEP.  No further TOC needs.   Final next level of care: Home/Self Care Barriers to Discharge: No Barriers Identified   Patient Goals and CMS Choice Patient states their goals for this hospitalization and ongoing recovery are:: return home      Discharge Placement                       Discharge Plan and Services                DME Arranged: Walker rolling DME Agency: [place]                  Social Determinants of Health (SDOH) Interventions     Readmission Risk Interventions No flowsheet data found.

## 2020-09-27 NOTE — Plan of Care (Signed)
  Problem: Pain Management: Goal: Pain level will decrease with appropriate interventions Outcome: Progressing   

## 2020-09-27 NOTE — Progress Notes (Signed)
   Subjective: 1 Day Post-Op Procedure(s) (LRB): TOTAL HIP ARTHROPLASTY ANTERIOR APPROACH (Right) Patient reports pain as mild.   Patient seen in rounds by Dr. Wynelle Link. Patient is well, and has had no acute complaints or problems. Denies SOB, chest pain, or calf pain. No acute overnight events. Will continue therapy today.     Objective: Vital signs in last 24 hours: Temp:  [97.5 F (36.4 C)-98.7 F (37.1 C)] 98.7 F (37.1 C) (09/01 0556) Pulse Rate:  [70-90] 84 (09/01 0556) Resp:  [14-20] 16 (09/01 0556) BP: (123-177)/(67-98) 136/72 (09/01 0556) SpO2:  [93 %-100 %] 93 % (09/01 0556) Weight:  [113.4 kg] 113.4 kg (08/31 0908)  Intake/Output from previous day:  Intake/Output Summary (Last 24 hours) at 09/27/2020 0653 Last data filed at 09/27/2020 0556 Gross per 24 hour  Intake 2160 ml  Output 2700 ml  Net -540 ml     Intake/Output this shift: Total I/O In: -  Out: 1350 [Urine:1350]  Labs: Recent Labs    09/27/20 0336  HGB 10.3*   Recent Labs    09/27/20 0336  WBC 12.0*  RBC 3.57*  HCT 31.9*  PLT 317   Recent Labs    09/27/20 0336  NA 141  K 5.2*  CL 108  CO2 29  BUN 20  CREATININE 0.79  GLUCOSE 143*  CALCIUM 9.2   No results for input(s): LABPT, INR in the last 72 hours.  Exam: General - Patient is Alert and Oriented Extremity - Neurologically intact Neurovascular intact Intact pulses distally Dorsiflexion/Plantar flexion intact Dressing - dressing C/D/I Motor Function - intact, moving foot and toes well on exam.   Past Medical History:  Diagnosis Date   Anxiety    Arthritis    Cancer ([place])    hxof precancerous cervix- removed    Depression    Diabetes mellitus without complication ([place])    hx of controlled with diet now    Dysrhythmia    palpitations; evaluated by cardiology in 2009; holter monitor showed NSR   GERD (gastroesophageal reflux disease)    Hypertension    hx of but had 100 lb weight loss    PONV (postoperative nausea and  vomiting)    Sleep apnea    hx of no longer has to use CPAP     Assessment/Plan: 1 Day Post-Op Procedure(s) (LRB): TOTAL HIP ARTHROPLASTY ANTERIOR APPROACH (Right) Active Problems:   S/P total right hip arthroplasty  Estimated body mass index is 40.35 kg/m as calculated from the following:   Height as of this encounter: '5\' 6"'$  (1.676 m).   Weight as of this encounter: 113.4 kg. Up with therapy  DVT Prophylaxis - Xarelto and TED hose Weight bearing as tolerated. Continue therapy.  Plan is to go Home after hospital stay.  Plan for two sessions with PT today, and if meeting goals, will plan for discharge this afternoon.   Patient to follow up in two weeks with Dr. Wynelle Link in clinic.   The PDMP database was reviewed today prior to any opioid medications being prescribed to this patient.Fenton Foy, MBA, PA-C Orthopedic Surgery 09/27/2020, 6:53 AM

## 2020-09-27 NOTE — Progress Notes (Signed)
Physical Therapy Treatment Patient Details Name: Theresa Horton MRN: [id number] DOB: Apr 07, 1960 Today's Date: 09/27/2020       PT Comments    Patient making excellent progress with mobility. No assist needed for bed mobility or sit<>stand transfer from EOB or toilet. Pt ambulated short hallway distance, ~60', and completed stair training with single rail and SPC to simulate home. No overt LOB noted throughout and pt verbalized safe guarding position for family to provide. Reviewed remaining exercises in HEP handout and addressed questions for discharge. She is safe to return home with assist from family. Acute PT will continue to progress during hospital stay.    09/27/20 1300  PT Visit Information  Last PT Received On 09/27/20  Assistance Needed +1  History of Present Illness Pt is a 60 y.o. female s/p Rt THA anterior approach on 09/26/20. PMH significant for anxiety, depression, DM, HTN, OA, Back surgery (x3, has drop foot), Lt THA (2015).  Subjective Data  Patient Stated Goal Go home tomorrow  Precautions  Precautions Fall  Restrictions  Weight Bearing Restrictions No  RLE Weight Bearing WBAT  Other Position/Activity Restrictions WBAT  Pain Assessment  Pain Assessment Faces  Faces Pain Scale 4  Pain Location Rt hip  Pain Descriptors / Indicators Burning;Sore  Pain Intervention(s) Limited activity within patient's tolerance;Monitored during session;Premedicated before session  Cognition  Arousal/Alertness Awake/alert  Behavior During Therapy WFL for tasks assessed/performed  Overall Cognitive Status Within Functional Limits for tasks assessed  Bed Mobility  Overal bed mobility Modified Independent  General bed mobility comments pt taking extra time and HOB elevated slightly. pt used bed rail and reports has four poster and uses posts at home to sit up.  Transfers  Overall transfer level Needs assistance  Equipment used Rolling walker (2 wheeled)  Transfers Sit to/from Stand   Sit to Stand Min guard;Supervision  General transfer comment cues for hand placement on EOB to initiate power up, no assist needed, guarding for safety with rise.  Ambulation/Gait  Ambulation/Gait assistance Min assist;Min guard  Assistive device Rolling walker (2 wheeled)  Gait Pattern/deviations Step-through pattern;Decreased stride length;Decreased weight shift to right  General Gait Details cues for proximity to RW and pt initaited with step through pattern. no overt LOB, overall gait very slow and cautious. pt reporting fatigued with UE's and overall energy due to poor activity tolerance.  Gait velocity decr  Balance  Overall balance assessment Needs assistance  Sitting-balance support Feet supported  Sitting balance-Leahy Scale Good  Standing balance support Bilateral upper extremity supported;During functional activity  Standing balance-Leahy Scale Poor  Standing balance comment use of external support  Exercises  Exercises Total Joint  Total Joint Exercises  Ankle Circles/Pumps AROM;Both;Seated;10 reps  Short Arc Quad AROM;Right;5 reps;Seated  Long Arc Quad AROM;Right;5 reps;Seated  Hip ABduction/ADduction AROM;Right;5 reps;Seated  PT - End of Session  Equipment Utilized During Treatment Gait belt  Activity Tolerance Patient limited by fatigue;Patient limited by lethargy  Patient left in chair;with call bell/phone within reach;with chair alarm set  Nurse Communication Mobility status   PT - Assessment/Plan  PT Plan Current plan remains appropriate  PT Visit Diagnosis Unsteadiness on feet (R26.81);Muscle weakness (generalized) (M62.81);Pain  Pain - Right/Left Right  Pain - part of body Hip  PT Frequency (ACUTE ONLY) 7X/week  Follow Up Recommendations Follow surgeon's recommendation for DC plan and follow-up therapies  PT equipment Rolling walker with 5" wheels  AM-PAC PT "6 Clicks" Mobility Outcome Measure (Version 2)  Help needed turning from your  back to your side while  in a flat bed without using bedrails? 4  Help needed moving from lying on your back to sitting on the side of a flat bed without using bedrails? 3  Help needed moving to and from a bed to a chair (including a wheelchair)? 3  Help needed standing up from a chair using your arms (e.g., wheelchair or bedside chair)? 3  Help needed to walk in hospital room? 3  Help needed climbing 3-5 steps with a railing?  3  6 Click Score 19  Consider Recommendation of Discharge To: Home with [facility]  Progressive Mobility  What is the highest level of mobility based on the progressive mobility assessment? Level 5 (Walks with assist in room/hall) - Balance while stepping forward/back and can walk in room with assist - Complete  Mobility Ambulated with assistance in hallway  PT Goal Progression  Progress towards PT goals Progressing toward goals  Acute Rehab PT Goals  PT Goal Formulation With patient/family  Time For Goal Achievement 10/10/20  Potential to Achieve Goals Good  PT Time Calculation  PT Start Time (ACUTE ONLY) 1341  PT Stop Time (ACUTE ONLY) 1404  PT Time Calculation (min) (ACUTE ONLY) 23 min  PT General Charges  $$ ACUTE PT VISIT 1 Visit  PT Treatments  $Gait Training 8-22 mins  $Therapeutic Exercise 8-22 mins    Theresa Horton, DPT Acute Rehabilitation Services Office [phone] Pager [phone]   Theresa Horton 09/27/2020, 5:06 PM

## 2020-09-27 NOTE — Progress Notes (Signed)
Physical Therapy Treatment Patient Details Name: Theresa Horton MRN: [id number] DOB: [birth year] Today's Date: 09/27/2020    History of Present Illness Pt is a 60 y.o. female s/p Rt THA anterior approach on 09/26/20. PMH significant for anxiety, depression, DM, HTN, OA, Back surgery (x3, has drop foot), Lt THA (2015).    PT Comments    Patient is progressing well with acute PT and ambulated ~100' with min guard and intermittent assist to manage walker position. Pt fatigued quickly and is heavily reliant on UE support with walker and needed standing break to recover. Educated on seated exercises for ROM and circulation from HEP. Will follow up for additional session to progress gait and stair training in preparation for safe discharge home.     Follow Up Recommendations  Follow surgeon's recommendation for DC plan and follow-up therapies     Equipment Recommendations  Rolling walker with 5" wheels    Recommendations for Other Services       Precautions / Restrictions Precautions Precautions: Fall Restrictions Weight Bearing Restrictions: No RLE Weight Bearing: Weight bearing as tolerated Other Position/Activity Restrictions: WBAT    Mobility  Bed Mobility Overal bed mobility: Modified Independent             General bed mobility comments: pt taking extra time and HOB elevated slightly. pt used bed rail and reports has four poster and uses posts at home to sit up.    Transfers Overall transfer level: Needs assistance Equipment used: Rolling walker (2 wheeled) Transfers: Sit to/from Stand Sit to Stand: Min guard;Supervision         General transfer comment: cues for hand placement on EOB to initiate power up, no assist needed, guarding for safety with rise.  Ambulation/Gait Ambulation/Gait assistance: Min assist;Min guard Gait Distance (Feet): 100 Feet Assistive device: Rolling walker (2 wheeled) Gait Pattern/deviations: Step-through pattern;Decreased stride  length;Decreased weight shift to right Gait velocity: decr   General Gait Details: cues for proximity to RW and pt initaited with step through pattern. no overt LOB, overall gait very slow and cautious. pt reporting fatigued with UE's and overall energy due to poor activity tolerance.   Stairs             Wheelchair Mobility    Modified Rankin (Stroke Patients Only)       Balance Overall balance assessment: Needs assistance Sitting-balance support: Feet supported Sitting balance-Leahy Scale: Good     Standing balance support: Bilateral upper extremity supported;During functional activity Standing balance-Leahy Scale: Poor Standing balance comment: use of external support                            Cognition Arousal/Alertness: Awake/alert Behavior During Therapy: WFL for tasks assessed/performed Overall Cognitive Status: Within Functional Limits for tasks assessed                                        Exercises Total Joint Exercises Ankle Circles/Pumps: AROM;Both;Seated;15 reps Quad Sets: AROM;Right;10 reps;Seated Heel Slides: AROM;Right;5 reps;Seated    General Comments        Pertinent Vitals/Pain Pain Assessment: Faces Faces Pain Scale: Hurts little more Pain Location: Rt hip Pain Descriptors / Indicators: Burning;Sore Pain Intervention(s): Limited activity within patient's tolerance;Monitored during session;Premedicated before session;Repositioned    Home Living  Prior Function            PT Goals (current goals can now be found in the care plan section) Acute Rehab PT Goals Patient Stated Goal: Go home tomorrow PT Goal Formulation: With patient/family Time For Goal Achievement: 10/10/20 Potential to Achieve Goals: Good Progress towards PT goals: Progressing toward goals    Frequency    7X/week      PT Plan Current plan remains appropriate    Co-evaluation               AM-PAC PT "6 Clicks" Mobility   Outcome Measure  Help needed turning from your back to your side while in a flat bed without using bedrails?: None Help needed moving from lying on your back to sitting on the side of a flat bed without using bedrails?: A Little Help needed moving to and from a bed to a chair (including a wheelchair)?: A Little Help needed standing up from a chair using your arms (e.g., wheelchair or bedside chair)?: A Little Help needed to walk in hospital room?: A Little Help needed climbing 3-5 steps with a railing? : A Little 6 Click Score: 19    End of Session Equipment Utilized During Treatment: Gait belt Activity Tolerance: Patient limited by fatigue;Patient limited by lethargy Patient left: in chair;with call bell/phone within reach;with chair alarm set Nurse Communication: Mobility status PT Visit Diagnosis: Unsteadiness on feet (R26.81);Muscle weakness (generalized) (M62.81);Pain Pain - Right/Left: Right Pain - part of body: Hip     Time: [id number] PT Time Calculation (min) (ACUTE ONLY): 27 min  Charges:  $Gait Training: 8-22 mins $Therapeutic Exercise: 8-22 mins                     Verner Mould, DPT Acute Rehabilitation Services Office [phone] Pager [phone]    Jacques Navy 09/27/2020, 12:13 PM

## 2020-09-27 NOTE — Plan of Care (Signed)
  Problem: Activity: Goal: Ability to avoid complications of mobility impairment will improve Outcome: Progressing Goal: Ability to tolerate increased activity will improve Outcome: Progressing   Problem: Pain Management: Goal: Pain level will decrease with appropriate interventions Outcome: Progressing   

## 2020-10-08 NOTE — Discharge Summary (Signed)
Physician Discharge Summary   Patient ID: Theresa Horton MRN: [id number] DOB/AGE: 1960/07/04 60 y.o.  Admit date: 09/26/2020 Discharge date: 09/27/2020  Primary Diagnosis:  Osteoarthritis of Right Hip  Admission Diagnoses:  Past Medical History:  Diagnosis Date   Anxiety    Arthritis    Cancer ([place])    hxof precancerous cervix- removed    Depression    Diabetes mellitus without complication ([place])    hx of controlled with diet now    Dysrhythmia    palpitations; evaluated by cardiology in 2009; holter monitor showed NSR   GERD (gastroesophageal reflux disease)    Hypertension    hx of but had 100 lb weight loss    PONV (postoperative nausea and vomiting)    Sleep apnea    hx of no longer has to use CPAP    Discharge Diagnoses:   Active Problems:   S/P total right hip arthroplasty  Estimated body mass index is 40.35 kg/m as calculated from the following:   Height as of this encounter: '5\' 6"'$  (1.676 m).   Weight as of this encounter: 113.4 kg.  Procedure:  Procedure(s) (LRB): TOTAL HIP ARTHROPLASTY ANTERIOR APPROACH (Right)   Consults: None  HPI: Theresa Horton is a 60 y.o. female who has advanced end-  stage arthritis of their Right  hip with progressively worsening pain and  dysfunction.The patient has failed nonoperative management and presents for  total hip arthroplasty.   Laboratory Data: Admission on 09/26/2020, Discharged on 09/27/2020  Component Date Value Ref Range Status   Glucose-Capillary 09/26/2020 78  70 - 99 mg/dL Final   Glucose reference range applies only to samples taken after fasting for at least 8 hours.   Glucose-Capillary 09/26/2020 74  70 - 99 mg/dL Final   Glucose reference range applies only to samples taken after fasting for at least 8 hours.   Glucose-Capillary 09/26/2020 127 (A) 70 - 99 mg/dL Final   Glucose reference range applies only to samples taken after fasting for at least 8 hours.   Glucose-Capillary 09/26/2020 135 (A) 70 - 99  mg/dL Final   Glucose reference range applies only to samples taken after fasting for at least 8 hours.   WBC 09/27/2020 12.0 (A) 4.0 - 10.5 K/uL Final   RBC 09/27/2020 3.57 (A) 3.87 - 5.11 MIL/uL Final   Hemoglobin 09/27/2020 10.3 (A) 12.0 - 15.0 g/dL Final   HCT 09/27/2020 31.9 (A) 36.0 - 46.0 % Final   MCV 09/27/2020 89.4  80.0 - 100.0 fL Final   MCH 09/27/2020 28.9  26.0 - 34.0 pg Final   MCHC 09/27/2020 32.3  30.0 - 36.0 g/dL Final   RDW 09/27/2020 13.0  11.5 - 15.5 % Final   Platelets 09/27/2020 317  150 - 400 K/uL Final   nRBC 09/27/2020 0.0  0.0 - 0.2 % Final   Performed at [facility], [place] [address] [place], [place] [address]   Sodium 09/27/2020 141  135 - 145 mmol/L Final   Potassium 09/27/2020 5.2 (A) 3.5 - 5.1 mmol/L Final   Chloride 09/27/2020 108  98 - 111 mmol/L Final   CO2 09/27/2020 29  22 - 32 mmol/L Final   Glucose, Bld 09/27/2020 143 (A) 70 - 99 mg/dL Final   Glucose reference range applies only to samples taken after fasting for at least 8 hours.   BUN 09/27/2020 20  6 - 20 mg/dL Final   Creatinine, Ser 09/27/2020 0.79  0.44 - 1.00 mg/dL Final  Calcium 09/27/2020 9.2  8.9 - 10.3 mg/dL Final   GFR, Estimated 09/27/2020 >60  >60 mL/min Final   Comment: (NOTE) Calculated using the CKD-EPI Creatinine Equation (2021)    Anion gap 09/27/2020 4 (A) 5 - 15 Final   Performed at [facility], [place] [address], [place],  [address]   Glucose-Capillary 09/26/2020 128 (A) 70 - 99 mg/dL Final   Glucose reference range applies only to samples taken after fasting for at least 8 hours.   Glucose-Capillary 09/27/2020 109 (A) 70 - 99 mg/dL Final   Glucose reference range applies only to samples taken after fasting for at least 8 hours.   Glucose-Capillary 09/27/2020 127 (A) 70 - 99 mg/dL Final   Glucose reference range applies only to samples taken after fasting for at least 8 hours.  Orders Only on 09/24/2020  Component Date Value  Ref Range Status   SARS Coronavirus 2 09/24/2020 RESULT: NEGATIVE   Final   Comment: RESULT: NEGATIVESARS-CoV-2 INTERPRETATION:A NEGATIVE  test result means that SARS-CoV-2 RNA was not present in the specimen above the limit of detection of this test. This does not preclude a possible SARS-CoV-2 infection and should not be used as the  sole basis for patient management decisions. Negative results must be combined with clinical observations, patient history, and epidemiological information. Optimum specimen types and timing for peak viral levels during infections caused by SARS-CoV-2  have not been determined. Collection of multiple specimens or types of specimens may be necessary to detect virus. Improper specimen collection and handling, sequence variability under primers/probes, or organism present below the limit of detection may  lead to false negative results. Positive and negative predictive values of testing are highly dependent on prevalence. False negative test results are more likely when prevalence of disease is high.The expected result is NEGATIVE.Fact S                          heet for  Healthcare Providers: LocalChronicle.no Sheet for Patients: SalonLookup.es Reference Range - Negative   Hospital Outpatient Visit on 09/13/2020  Component Date Value Ref Range Status   MRSA, PCR 09/13/2020 NEGATIVE  NEGATIVE Final   Staphylococcus aureus 09/13/2020 NEGATIVE  NEGATIVE Final   Comment: (NOTE) The Xpert SA Assay (FDA approved for NASAL specimens in patients 75 years of age and older), is one component of a comprehensive surveillance program. It is not intended to diagnose infection nor to guide or monitor treatment. Performed at [facility], [place] [address] [place], [place] [address]    Hgb A1c MFr Bld 09/13/2020 5.8 (A) 4.8 - 5.6 % Final   Comment: (NOTE) Pre diabetes:          5.7%-6.4%  Diabetes:               >6.4%  Glycemic control for   <7.0% adults with diabetes    Mean Plasma Glucose 09/13/2020 119.76  mg/dL Final   Performed at [facility], [place] [address], [place], [place] [address]   WBC 09/13/2020 7.6  4.0 - 10.5 K/uL Final   RBC 09/13/2020 4.24  3.87 - 5.11 MIL/uL Final   Hemoglobin 09/13/2020 12.2  12.0 - 15.0 g/dL Final   HCT 09/13/2020 39.5  36.0 - 46.0 % Final   MCV 09/13/2020 93.2  80.0 - 100.0 fL Final   MCH 09/13/2020 28.8  26.0 - 34.0 pg Final   MCHC 09/13/2020 30.9  30.0 - 36.0  g/dL Final   RDW 09/13/2020 13.2  11.5 - 15.5 % Final   Platelets 09/13/2020 357  150 - 400 K/uL Final   nRBC 09/13/2020 0.0  0.0 - 0.2 % Final   Performed at [facility], [place] [address], [place], [place] [address]   Sodium 09/13/2020 138  135 - 145 mmol/L Final   Potassium 09/13/2020 4.7  3.5 - 5.1 mmol/L Final   Chloride 09/13/2020 104  98 - 111 mmol/L Final   CO2 09/13/2020 28  22 - 32 mmol/L Final   Glucose, Bld 09/13/2020 120 (A) 70 - 99 mg/dL Final   Glucose reference range applies only to samples taken after fasting for at least 8 hours.   BUN 09/13/2020 19  6 - 20 mg/dL Final   Creatinine, Ser 09/13/2020 0.99  0.44 - 1.00 mg/dL Final   Calcium 09/13/2020 9.4  8.9 - 10.3 mg/dL Final   Total Protein 09/13/2020 6.7  6.5 - 8.1 g/dL Final   Albumin 09/13/2020 3.9  3.5 - 5.0 g/dL Final   AST 09/13/2020 17  15 - 41 U/L Final   ALT 09/13/2020 18  0 - 44 U/L Final   Alkaline Phosphatase 09/13/2020 99  38 - 126 U/L Final   Total Bilirubin 09/13/2020 0.5  0.3 - 1.2 mg/dL Final   GFR, Estimated 09/13/2020 >60  >60 mL/min Final   Comment: (NOTE) Calculated using the CKD-EPI Creatinine Equation (2021)    Anion gap 09/13/2020 6  5 - 15 Final   Performed at [facility], [place] [address] [place], [place] [address]   Prothrombin Time 09/13/2020 13.2  11.4 - 15.2 seconds Final   INR 09/13/2020 1.0  0.8 - 1.2 Final   Comment: (NOTE) INR goal  varies based on device and disease states. Performed at [facility], [place] [address], [place], [place] [address]    ABO/RH(D) 09/13/2020 A POS   Final   Antibody Screen 09/13/2020 NEG   Final   Sample Expiration 09/13/2020 09/27/2020,2359   Final   Extend sample reason 09/13/2020    Final                   Value:NO TRANSFUSIONS OR PREGNANCY IN THE PAST 3 MONTHS Performed at [place] [address] [place], [place] [address]    Glucose-Capillary 09/13/2020 106 (A) 70 - 99 mg/dL Final   Glucose reference range applies only to samples taken after fasting for at least 8 hours.     X-Rays:DG Pelvis Portable  Result Date: 09/26/2020 CLINICAL DATA:  Status post right hip replacement EXAM: PORTABLE PELVIS 1-2 VIEWS COMPARISON:  05/11/2013 FINDINGS: Normal placement of right hip arthroplasty. Total hip replacement in satisfactory position alignment. 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 Electronically Signed   By: Franchot Gallo M.D.   On: 09/26/2020 15:01   DG C-Arm 1-60 Min-No Report  Result Date: 09/26/2020 Fluoroscopy was utilized by the requesting physician.  No radiographic interpretation.   DG HIP OPERATIVE UNILAT W OR W/O PELVIS RIGHT  Result Date: 09/26/2020 CLINICAL DATA:  Right hip replacement EXAM: OPERATIVE right HIP (WITH PELVIS IF PERFORMED) 3 VIEWS TECHNIQUE: Fluoroscopic spot image(s) were submitted for interpretation post-operatively. COMPARISON:  05/11/2013 FINDINGS: Right hip replacement in satisfactory position alignment. No fracture or complication. Pre-existing left hip replacement noted. IMPRESSION: Satisfactory right hip replacement. Electronically Signed   By: Franchot Gallo M.D.   On: 09/26/2020  13:40    EKG: Orders placed or performed during the hospital encounter of 09/13/20   EKG 12 lead per protocol   EKG 12 lead per protocol      Hospital Course: Theresa Horton is a 60 y.o. who was admitted to [facility]. They were brought to the operating room on 09/26/2020 and underwent Procedure(s): [place].  Patient tolerated the procedure well and was later transferred to the recovery room and then to the orthopaedic floor for postoperative care. They were given PO and IV analgesics for pain control following their surgery. They were given 24 hours of postoperative antibiotics of  Anti-infectives (From admission, onward)    Start     Dose/Rate Route Frequency Ordered Stop   09/26/20 1700  ceFAZolin (ANCEF) IVPB 2g/100 mL premix        2 g 200 mL/hr over 30 Minutes Intravenous Every 6 hours 09/26/20 1530 09/26/20 2143   09/26/20 0900  ceFAZolin (ANCEF) IVPB 2g/100 mL premix        2 g 200 mL/hr over 30 Minutes Intravenous On call to O.R. 09/26/20 [id number] 09/26/20 1128      and started on DVT prophylaxis in the form of Xarelto and TED hose.   PT and OT were ordered for total joint protocol. Discharge planning consulted to help with postop disposition and equipment needs.  Patient had an uneventful night on the evening of surgery. They started to get up OOB with therapy on 09/26/2020. Pt was seen during rounds and was ready to go home pending progress with therapy. She worked with therapy on POD #1 and was meeting goals. Pt was discharged to home later that day in stable condition.  Diet: Regular diet Activity: WBAT Follow-up: in 2 weeks Disposition: Home Discharged Condition: good   Discharge Instructions     Call MD / Call 911   Complete by: As directed    If you experience chest pain or shortness of breath, CALL 911 and be transported to the hospital emergency room.  If you develope a fever above 101 F, pus (white drainage) or increased drainage or redness at the wound, or calf pain, call your surgeon's office.   Call MD / Call 911   Complete by: As directed    If you experience  chest pain or shortness of breath, CALL 911 and be transported to the hospital emergency room.  If you develope a fever above 101 F, pus (white drainage) or increased drainage or redness at the wound, or calf pain, call your surgeon's office.   Change dressing   Complete by: As directed    You have an adhesive waterproof bandage over the incision. Leave this in place until your first follow-up appointment. Once you remove this you will not need to place another bandage.   Change dressing   Complete by: As directed    You have an adhesive waterproof bandage over the incision. Leave this in place until your first follow-up appointment. Once you remove this you will not need to place another bandage.   Constipation Prevention   Complete by: As directed    Drink plenty of fluids.  Prune juice may be helpful.  You may use a stool softener, such as Colace (over the counter) 100 mg twice a day.  Use MiraLax (over the counter) for constipation as needed.   Constipation Prevention   Complete by: As directed    Drink plenty of fluids.  Prune juice may be  helpful.  You may use a stool softener, such as Colace (over the counter) 100 mg twice a day.  Use MiraLax (over the counter) for constipation as needed.   Diet - low sodium heart healthy   Complete by: As directed    Diet - low sodium heart healthy   Complete by: As directed    Do not sit on low chairs, stoools or toilet seats, as it may be difficult to get up from low surfaces   Complete by: As directed    Do not sit on low chairs, stoools or toilet seats, as it may be difficult to get up from low surfaces   Complete by: As directed    Driving restrictions   Complete by: As directed    No driving for two weeks   Driving restrictions   Complete by: As directed    No driving for two weeks   Post-operative opioid taper instructions:   Complete by: As directed    POST-OPERATIVE OPIOID TAPER INSTRUCTIONS: It is important to wean off of your opioid  medication as soon as possible. If you do not need pain medication after your surgery it is ok to stop day one. Opioids include: Codeine, Hydrocodone(Norco, Vicodin), Oxycodone(Percocet, oxycontin) and hydromorphone amongst others.  Long term and even short term use of opiods can cause: Increased pain response Dependence Constipation Depression Respiratory depression And more.  Withdrawal symptoms can include Flu like symptoms Nausea, vomiting And more Techniques to manage these symptoms Hydrate well Eat regular healthy meals Stay active Use relaxation techniques(deep breathing, meditating, yoga) Do Not substitute Alcohol to help with tapering If you have been on opioids for less than two weeks and do not have pain than it is ok to stop all together.  Plan to wean off of opioids This plan should start within one week post op of your joint replacement. Maintain the same interval or time between taking each dose and first decrease the dose.  Cut the total daily intake of opioids by one tablet each day Next start to increase the time between doses. The last dose that should be eliminated is the evening dose.      Post-operative opioid taper instructions:   Complete by: As directed    POST-OPERATIVE OPIOID TAPER INSTRUCTIONS: It is important to wean off of your opioid medication as soon as possible. If you do not need pain medication after your surgery it is ok to stop day one. Opioids include: Codeine, Hydrocodone(Norco, Vicodin), Oxycodone(Percocet, oxycontin) and hydromorphone amongst others.  Long term and even short term use of opiods can cause: Increased pain response Dependence Constipation Depression Respiratory depression And more.  Withdrawal symptoms can include Flu like symptoms Nausea, vomiting And more Techniques to manage these symptoms Hydrate well Eat regular healthy meals Stay active Use relaxation techniques(deep breathing, meditating, yoga) Do Not  substitute Alcohol to help with tapering If you have been on opioids for less than two weeks and do not have pain than it is ok to stop all together.  Plan to wean off of opioids This plan should start within one week post op of your joint replacement. Maintain the same interval or time between taking each dose and first decrease the dose.  Cut the total daily intake of opioids by one tablet each day Next start to increase the time between doses. The last dose that should be eliminated is the evening dose.      TED hose   Complete by: As directed  Use stockings (TED hose) for three weeks on both leg(s).  You may remove them at night for sleeping.   TED hose   Complete by: As directed    Use stockings (TED hose) for three weeks on both leg(s).  You may remove them at night for sleeping.   Weight bearing as tolerated   Complete by: As directed    Weight bearing as tolerated   Complete by: As directed       Allergies as of 09/27/2020       Reactions   Lipitor [atorvastatin]    Blood pressure dropped    Nsaids Swelling   Penicillins Hives   Sulfa Antibiotics    Mouth had blisters and thrush    Terbinafine And Related    Constipation    Glipizide Rash   Glyburide Rash   Nalfon [fenoprofen Calcium] Swelling, Rash        Medication List     STOP taking these medications    cholecalciferol 25 MCG (1000 UNIT) tablet Commonly known as: VITAMIN D3   ciclopirox 0.77 % cream Commonly known as: LOPROX   cyclobenzaprine 10 MG tablet Commonly known as: FLEXERIL   Fish Oil 1000 MG Caps   Glucosamine Chond MSM Formula Tabs   HYDROcodone-acetaminophen 10-325 MG tablet Commonly known as: NORCO Replaced by: HYDROcodone-acetaminophen 5-325 MG tablet   Magnesium 250 MG Tabs   meloxicam 15 MG tablet Commonly known as: MOBIC   Menthol (Topical Analgesic) 3.7 % Gel   vitamin C 500 MG tablet Commonly known as: ASCORBIC ACID       TAKE these medications     ARIPiprazole 5 MG tablet Commonly known as: ABILIFY Take 5 mg by mouth at bedtime.   buPROPion 300 MG 24 hr tablet Commonly known as: WELLBUTRIN XL Take 300 mg by mouth daily.   docusate sodium 100 MG capsule Commonly known as: COLACE Take 100 mg by mouth daily.   DULoxetine 60 MG capsule Commonly known as: CYMBALTA Take 60 mg by mouth 2 (two) times daily. Pt takes both at nite per preop apapt   gabapentin 600 MG tablet Commonly known as: NEURONTIN Take 600 mg by mouth 3 (three) times daily as needed (Pain).   HYDROcodone-acetaminophen 5-325 MG tablet Commonly known as: NORCO/VICODIN Take 1-2 tablets by mouth every 6 (six) hours as needed for severe pain. Replaces: HYDROcodone-acetaminophen 10-325 MG tablet   losartan 50 MG tablet Commonly known as: COZAAR Take 50 mg by mouth daily.   methocarbamol 500 MG tablet Commonly known as: ROBAXIN Take 1 tablet (500 mg total) by mouth every 6 (six) hours as needed for muscle spasms.   omeprazole 20 MG capsule Commonly known as: PRILOSEC Take 20 mg by mouth daily as needed (Acid reflux).   Ozempic (1 MG/DOSE) 2 MG/1.5ML Sopn Generic drug: Semaglutide (1 MG/DOSE) Inject 1 mg into the skin every Tuesday.   phentermine 37.5 MG tablet Commonly known as: ADIPEX-P Take 37.5 mg by mouth daily.   rivaroxaban 10 MG Tabs tablet Commonly known as: XARELTO Take 1 tablet (10 mg total) by mouth daily with breakfast. Take for 20 days until completion of prescription, then take one 81 mg aspirin once a day for three weeks. Then discontinue aspirin.   simvastatin 20 MG tablet Commonly known as: ZOCOR Take 20 mg by mouth daily.               Discharge Care Instructions  (From admission, onward)  Start     Ordered   09/27/20 0000  Weight bearing as tolerated        09/27/20 0902   09/27/20 0000  Change dressing       Comments: You have an adhesive waterproof bandage over the incision. Leave this in place until  your first follow-up appointment. Once you remove this you will not need to place another bandage.   09/27/20 0902   09/27/20 0000  Weight bearing as tolerated        09/27/20 0902   09/27/20 0000  Change dressing       Comments: You have an adhesive waterproof bandage over the incision. Leave this in place until your first follow-up appointment. Once you remove this you will not need to place another bandage.   09/27/20 0902            Follow-up Information     Gaynelle Arabian, MD. Go on 10/10/2020.   Specialty: Orthopedic Surgery Why: You are scheduled for first post op appointment on Wednesday September 14th at 3:00pm. Contact information: [address] [place] [address] [id number]                 Signed: Fenton Foy, MBA, PA-C Orthopedic Surgery 10/08/2020, 1:34 PM

## 2020-10-26 DIAGNOSIS — E782 Mixed hyperlipidemia: Secondary | ICD-10-CM | POA: Diagnosis not present

## 2020-10-26 DIAGNOSIS — I10 Essential (primary) hypertension: Secondary | ICD-10-CM | POA: Diagnosis not present

## 2020-10-26 DIAGNOSIS — K2100 Gastro-esophageal reflux disease with esophagitis, without bleeding: Secondary | ICD-10-CM | POA: Diagnosis not present

## 2020-10-26 DIAGNOSIS — E1165 Type 2 diabetes mellitus with hyperglycemia: Secondary | ICD-10-CM | POA: Diagnosis not present

## 2020-10-26 DIAGNOSIS — E7849 Other hyperlipidemia: Secondary | ICD-10-CM | POA: Diagnosis not present

## 2020-10-26 DIAGNOSIS — Z79891 Long term (current) use of opiate analgesic: Secondary | ICD-10-CM | POA: Diagnosis not present

## 2020-10-30 DIAGNOSIS — D509 Iron deficiency anemia, unspecified: Secondary | ICD-10-CM | POA: Diagnosis not present

## 2020-10-30 DIAGNOSIS — I10 Essential (primary) hypertension: Secondary | ICD-10-CM | POA: Diagnosis not present

## 2020-10-30 DIAGNOSIS — Z1331 Encounter for screening for depression: Secondary | ICD-10-CM | POA: Diagnosis not present

## 2020-10-30 DIAGNOSIS — G4733 Obstructive sleep apnea (adult) (pediatric): Secondary | ICD-10-CM | POA: Diagnosis not present

## 2020-10-30 DIAGNOSIS — E1165 Type 2 diabetes mellitus with hyperglycemia: Secondary | ICD-10-CM | POA: Diagnosis not present

## 2020-10-30 DIAGNOSIS — F331 Major depressive disorder, recurrent, moderate: Secondary | ICD-10-CM | POA: Diagnosis not present

## 2020-10-30 DIAGNOSIS — E7849 Other hyperlipidemia: Secondary | ICD-10-CM | POA: Diagnosis not present

## 2020-10-30 DIAGNOSIS — Z1389 Encounter for screening for other disorder: Secondary | ICD-10-CM | POA: Diagnosis not present

## 2020-11-27 DIAGNOSIS — Z4789 Encounter for other orthopedic aftercare: Secondary | ICD-10-CM | POA: Diagnosis not present

## 2020-11-27 DIAGNOSIS — Z471 Aftercare following joint replacement surgery: Secondary | ICD-10-CM | POA: Diagnosis not present

## 2021-02-07 DIAGNOSIS — I10 Essential (primary) hypertension: Secondary | ICD-10-CM | POA: Diagnosis not present

## 2021-02-07 DIAGNOSIS — G4733 Obstructive sleep apnea (adult) (pediatric): Secondary | ICD-10-CM | POA: Diagnosis not present

## 2021-02-07 DIAGNOSIS — E559 Vitamin D deficiency, unspecified: Secondary | ICD-10-CM | POA: Diagnosis not present

## 2021-02-07 DIAGNOSIS — E782 Mixed hyperlipidemia: Secondary | ICD-10-CM | POA: Diagnosis not present

## 2021-02-07 DIAGNOSIS — K2100 Gastro-esophageal reflux disease with esophagitis, without bleeding: Secondary | ICD-10-CM | POA: Diagnosis not present

## 2021-02-07 DIAGNOSIS — E1165 Type 2 diabetes mellitus with hyperglycemia: Secondary | ICD-10-CM | POA: Diagnosis not present

## 2021-02-07 DIAGNOSIS — E7849 Other hyperlipidemia: Secondary | ICD-10-CM | POA: Diagnosis not present

## 2021-02-07 DIAGNOSIS — R946 Abnormal results of thyroid function studies: Secondary | ICD-10-CM | POA: Diagnosis not present

## 2021-02-11 DIAGNOSIS — I10 Essential (primary) hypertension: Secondary | ICD-10-CM | POA: Diagnosis not present

## 2021-02-11 DIAGNOSIS — E7849 Other hyperlipidemia: Secondary | ICD-10-CM | POA: Diagnosis not present

## 2021-02-11 DIAGNOSIS — R4582 Worries: Secondary | ICD-10-CM | POA: Diagnosis not present

## 2021-02-11 DIAGNOSIS — D509 Iron deficiency anemia, unspecified: Secondary | ICD-10-CM | POA: Diagnosis not present

## 2021-02-11 DIAGNOSIS — E1165 Type 2 diabetes mellitus with hyperglycemia: Secondary | ICD-10-CM | POA: Diagnosis not present

## 2021-02-11 DIAGNOSIS — Z23 Encounter for immunization: Secondary | ICD-10-CM | POA: Diagnosis not present

## 2021-02-11 DIAGNOSIS — Z79891 Long term (current) use of opiate analgesic: Secondary | ICD-10-CM | POA: Diagnosis not present

## 2021-02-11 DIAGNOSIS — Z0001 Encounter for general adult medical examination with abnormal findings: Secondary | ICD-10-CM | POA: Diagnosis not present

## 2021-02-12 DIAGNOSIS — F17210 Nicotine dependence, cigarettes, uncomplicated: Secondary | ICD-10-CM | POA: Diagnosis not present

## 2021-02-12 DIAGNOSIS — I10 Essential (primary) hypertension: Secondary | ICD-10-CM | POA: Diagnosis not present

## 2021-02-12 DIAGNOSIS — G4733 Obstructive sleep apnea (adult) (pediatric): Secondary | ICD-10-CM | POA: Diagnosis not present

## 2021-02-12 DIAGNOSIS — Z0001 Encounter for general adult medical examination with abnormal findings: Secondary | ICD-10-CM | POA: Diagnosis not present

## 2021-02-12 DIAGNOSIS — E7849 Other hyperlipidemia: Secondary | ICD-10-CM | POA: Diagnosis not present

## 2021-02-12 DIAGNOSIS — E1165 Type 2 diabetes mellitus with hyperglycemia: Secondary | ICD-10-CM | POA: Diagnosis not present

## 2021-02-12 DIAGNOSIS — D649 Anemia, unspecified: Secondary | ICD-10-CM | POA: Diagnosis not present

## 2021-02-12 DIAGNOSIS — F331 Major depressive disorder, recurrent, moderate: Secondary | ICD-10-CM | POA: Diagnosis not present

## 2021-05-03 DIAGNOSIS — Z96641 Presence of right artificial hip joint: Secondary | ICD-10-CM | POA: Diagnosis not present

## 2021-05-15 DIAGNOSIS — R262 Difficulty in walking, not elsewhere classified: Secondary | ICD-10-CM | POA: Diagnosis not present

## 2021-05-15 DIAGNOSIS — Z96641 Presence of right artificial hip joint: Secondary | ICD-10-CM | POA: Diagnosis not present

## 2021-05-15 DIAGNOSIS — M25551 Pain in right hip: Secondary | ICD-10-CM | POA: Diagnosis not present

## 2021-05-15 DIAGNOSIS — R531 Weakness: Secondary | ICD-10-CM | POA: Diagnosis not present

## 2021-05-15 DIAGNOSIS — M25651 Stiffness of right hip, not elsewhere classified: Secondary | ICD-10-CM | POA: Diagnosis not present

## 2021-05-16 DIAGNOSIS — M25551 Pain in right hip: Secondary | ICD-10-CM | POA: Diagnosis not present

## 2021-05-16 DIAGNOSIS — R262 Difficulty in walking, not elsewhere classified: Secondary | ICD-10-CM | POA: Diagnosis not present

## 2021-05-16 DIAGNOSIS — Z96641 Presence of right artificial hip joint: Secondary | ICD-10-CM | POA: Diagnosis not present

## 2021-05-16 DIAGNOSIS — M25651 Stiffness of right hip, not elsewhere classified: Secondary | ICD-10-CM | POA: Diagnosis not present

## 2021-05-16 DIAGNOSIS — R531 Weakness: Secondary | ICD-10-CM | POA: Diagnosis not present

## 2021-05-20 DIAGNOSIS — Z96641 Presence of right artificial hip joint: Secondary | ICD-10-CM | POA: Diagnosis not present

## 2021-05-20 DIAGNOSIS — M25651 Stiffness of right hip, not elsewhere classified: Secondary | ICD-10-CM | POA: Diagnosis not present

## 2021-05-20 DIAGNOSIS — R531 Weakness: Secondary | ICD-10-CM | POA: Diagnosis not present

## 2021-05-20 DIAGNOSIS — M25551 Pain in right hip: Secondary | ICD-10-CM | POA: Diagnosis not present

## 2021-05-20 DIAGNOSIS — R262 Difficulty in walking, not elsewhere classified: Secondary | ICD-10-CM | POA: Diagnosis not present

## 2021-05-27 DIAGNOSIS — Z96641 Presence of right artificial hip joint: Secondary | ICD-10-CM | POA: Diagnosis not present

## 2021-05-27 DIAGNOSIS — K2100 Gastro-esophageal reflux disease with esophagitis, without bleeding: Secondary | ICD-10-CM | POA: Diagnosis not present

## 2021-05-27 DIAGNOSIS — M25651 Stiffness of right hip, not elsewhere classified: Secondary | ICD-10-CM | POA: Diagnosis not present

## 2021-05-27 DIAGNOSIS — R531 Weakness: Secondary | ICD-10-CM | POA: Diagnosis not present

## 2021-05-27 DIAGNOSIS — E7849 Other hyperlipidemia: Secondary | ICD-10-CM | POA: Diagnosis not present

## 2021-05-27 DIAGNOSIS — E1165 Type 2 diabetes mellitus with hyperglycemia: Secondary | ICD-10-CM | POA: Diagnosis not present

## 2021-05-27 DIAGNOSIS — M25551 Pain in right hip: Secondary | ICD-10-CM | POA: Diagnosis not present

## 2021-05-27 DIAGNOSIS — R262 Difficulty in walking, not elsewhere classified: Secondary | ICD-10-CM | POA: Diagnosis not present

## 2021-05-27 DIAGNOSIS — E782 Mixed hyperlipidemia: Secondary | ICD-10-CM | POA: Diagnosis not present

## 2021-05-27 DIAGNOSIS — I10 Essential (primary) hypertension: Secondary | ICD-10-CM | POA: Diagnosis not present

## 2021-05-29 DIAGNOSIS — D509 Iron deficiency anemia, unspecified: Secondary | ICD-10-CM | POA: Diagnosis not present

## 2021-05-29 DIAGNOSIS — Z1231 Encounter for screening mammogram for malignant neoplasm of breast: Secondary | ICD-10-CM | POA: Diagnosis not present

## 2021-05-29 DIAGNOSIS — M25651 Stiffness of right hip, not elsewhere classified: Secondary | ICD-10-CM | POA: Diagnosis not present

## 2021-05-29 DIAGNOSIS — R4582 Worries: Secondary | ICD-10-CM | POA: Diagnosis not present

## 2021-05-29 DIAGNOSIS — R262 Difficulty in walking, not elsewhere classified: Secondary | ICD-10-CM | POA: Diagnosis not present

## 2021-05-29 DIAGNOSIS — R531 Weakness: Secondary | ICD-10-CM | POA: Diagnosis not present

## 2021-05-29 DIAGNOSIS — M25551 Pain in right hip: Secondary | ICD-10-CM | POA: Diagnosis not present

## 2021-05-29 DIAGNOSIS — M19011 Primary osteoarthritis, right shoulder: Secondary | ICD-10-CM | POA: Diagnosis not present

## 2021-05-29 DIAGNOSIS — Z79891 Long term (current) use of opiate analgesic: Secondary | ICD-10-CM | POA: Diagnosis not present

## 2021-05-29 DIAGNOSIS — E7849 Other hyperlipidemia: Secondary | ICD-10-CM | POA: Diagnosis not present

## 2021-05-29 DIAGNOSIS — Z96641 Presence of right artificial hip joint: Secondary | ICD-10-CM | POA: Diagnosis not present

## 2021-05-29 DIAGNOSIS — E1165 Type 2 diabetes mellitus with hyperglycemia: Secondary | ICD-10-CM | POA: Diagnosis not present

## 2021-05-29 DIAGNOSIS — M1711 Unilateral primary osteoarthritis, right knee: Secondary | ICD-10-CM | POA: Diagnosis not present

## 2021-05-29 DIAGNOSIS — I10 Essential (primary) hypertension: Secondary | ICD-10-CM | POA: Diagnosis not present

## 2021-06-03 DIAGNOSIS — R262 Difficulty in walking, not elsewhere classified: Secondary | ICD-10-CM | POA: Diagnosis not present

## 2021-06-03 DIAGNOSIS — Z96641 Presence of right artificial hip joint: Secondary | ICD-10-CM | POA: Diagnosis not present

## 2021-06-03 DIAGNOSIS — M25651 Stiffness of right hip, not elsewhere classified: Secondary | ICD-10-CM | POA: Diagnosis not present

## 2021-06-03 DIAGNOSIS — R531 Weakness: Secondary | ICD-10-CM | POA: Diagnosis not present

## 2021-06-03 DIAGNOSIS — M25551 Pain in right hip: Secondary | ICD-10-CM | POA: Diagnosis not present

## 2021-06-05 DIAGNOSIS — Z96641 Presence of right artificial hip joint: Secondary | ICD-10-CM | POA: Diagnosis not present

## 2021-06-05 DIAGNOSIS — M25551 Pain in right hip: Secondary | ICD-10-CM | POA: Diagnosis not present

## 2021-06-05 DIAGNOSIS — R531 Weakness: Secondary | ICD-10-CM | POA: Diagnosis not present

## 2021-06-05 DIAGNOSIS — R262 Difficulty in walking, not elsewhere classified: Secondary | ICD-10-CM | POA: Diagnosis not present

## 2021-06-05 DIAGNOSIS — M25651 Stiffness of right hip, not elsewhere classified: Secondary | ICD-10-CM | POA: Diagnosis not present

## 2021-06-11 DIAGNOSIS — M25551 Pain in right hip: Secondary | ICD-10-CM | POA: Diagnosis not present

## 2021-06-11 DIAGNOSIS — R531 Weakness: Secondary | ICD-10-CM | POA: Diagnosis not present

## 2021-06-11 DIAGNOSIS — R262 Difficulty in walking, not elsewhere classified: Secondary | ICD-10-CM | POA: Diagnosis not present

## 2021-06-11 DIAGNOSIS — M25651 Stiffness of right hip, not elsewhere classified: Secondary | ICD-10-CM | POA: Diagnosis not present

## 2021-06-11 DIAGNOSIS — Z96641 Presence of right artificial hip joint: Secondary | ICD-10-CM | POA: Diagnosis not present

## 2021-06-17 DIAGNOSIS — R262 Difficulty in walking, not elsewhere classified: Secondary | ICD-10-CM | POA: Diagnosis not present

## 2021-06-17 DIAGNOSIS — M25651 Stiffness of right hip, not elsewhere classified: Secondary | ICD-10-CM | POA: Diagnosis not present

## 2021-06-17 DIAGNOSIS — R531 Weakness: Secondary | ICD-10-CM | POA: Diagnosis not present

## 2021-06-17 DIAGNOSIS — Z96641 Presence of right artificial hip joint: Secondary | ICD-10-CM | POA: Diagnosis not present

## 2021-06-17 DIAGNOSIS — M25551 Pain in right hip: Secondary | ICD-10-CM | POA: Diagnosis not present

## 2021-06-19 DIAGNOSIS — Z96641 Presence of right artificial hip joint: Secondary | ICD-10-CM | POA: Diagnosis not present

## 2021-06-19 DIAGNOSIS — M25551 Pain in right hip: Secondary | ICD-10-CM | POA: Diagnosis not present

## 2021-06-19 DIAGNOSIS — R531 Weakness: Secondary | ICD-10-CM | POA: Diagnosis not present

## 2021-06-19 DIAGNOSIS — M25651 Stiffness of right hip, not elsewhere classified: Secondary | ICD-10-CM | POA: Diagnosis not present

## 2021-06-19 DIAGNOSIS — R262 Difficulty in walking, not elsewhere classified: Secondary | ICD-10-CM | POA: Diagnosis not present

## 2021-06-20 DIAGNOSIS — Z96641 Presence of right artificial hip joint: Secondary | ICD-10-CM | POA: Diagnosis not present

## 2021-06-20 DIAGNOSIS — E669 Obesity, unspecified: Secondary | ICD-10-CM | POA: Diagnosis not present

## 2021-09-25 DIAGNOSIS — E7849 Other hyperlipidemia: Secondary | ICD-10-CM | POA: Diagnosis not present

## 2021-09-25 DIAGNOSIS — E782 Mixed hyperlipidemia: Secondary | ICD-10-CM | POA: Diagnosis not present

## 2021-09-25 DIAGNOSIS — K2100 Gastro-esophageal reflux disease with esophagitis, without bleeding: Secondary | ICD-10-CM | POA: Diagnosis not present

## 2021-09-25 DIAGNOSIS — I10 Essential (primary) hypertension: Secondary | ICD-10-CM | POA: Diagnosis not present

## 2021-09-25 DIAGNOSIS — E1165 Type 2 diabetes mellitus with hyperglycemia: Secondary | ICD-10-CM | POA: Diagnosis not present

## 2021-09-25 DIAGNOSIS — R946 Abnormal results of thyroid function studies: Secondary | ICD-10-CM | POA: Diagnosis not present

## 2021-09-25 DIAGNOSIS — Z79891 Long term (current) use of opiate analgesic: Secondary | ICD-10-CM | POA: Diagnosis not present

## 2021-09-27 DIAGNOSIS — E1165 Type 2 diabetes mellitus with hyperglycemia: Secondary | ICD-10-CM | POA: Diagnosis not present

## 2021-09-27 DIAGNOSIS — D509 Iron deficiency anemia, unspecified: Secondary | ICD-10-CM | POA: Diagnosis not present

## 2021-09-27 DIAGNOSIS — M19011 Primary osteoarthritis, right shoulder: Secondary | ICD-10-CM | POA: Diagnosis not present

## 2021-09-27 DIAGNOSIS — Z79891 Long term (current) use of opiate analgesic: Secondary | ICD-10-CM | POA: Diagnosis not present

## 2021-09-27 DIAGNOSIS — F331 Major depressive disorder, recurrent, moderate: Secondary | ICD-10-CM | POA: Diagnosis not present

## 2021-09-27 DIAGNOSIS — I10 Essential (primary) hypertension: Secondary | ICD-10-CM | POA: Diagnosis not present

## 2021-09-27 DIAGNOSIS — M1711 Unilateral primary osteoarthritis, right knee: Secondary | ICD-10-CM | POA: Diagnosis not present

## 2021-09-27 DIAGNOSIS — E7849 Other hyperlipidemia: Secondary | ICD-10-CM | POA: Diagnosis not present

## 2021-09-27 DIAGNOSIS — G4733 Obstructive sleep apnea (adult) (pediatric): Secondary | ICD-10-CM | POA: Diagnosis not present

## 2021-10-29 DIAGNOSIS — G4733 Obstructive sleep apnea (adult) (pediatric): Secondary | ICD-10-CM | POA: Diagnosis not present

## 2021-12-05 DIAGNOSIS — Z96643 Presence of artificial hip joint, bilateral: Secondary | ICD-10-CM | POA: Diagnosis not present

## 2021-12-05 DIAGNOSIS — Z96641 Presence of right artificial hip joint: Secondary | ICD-10-CM | POA: Diagnosis not present

## 2022-01-31 IMAGING — RF DG HIP (WITH PELVIS) OPERATIVE*R*
1 series · 3 of 3 positions shown · non-contrast
Comparison: 05/11/2013

CLINICAL DATA: Right hip replacement

EXAM:
OPERATIVE right HIP (WITH PELVIS IF PERFORMED) 3 VIEWS
TECHNIQUE: Fluoroscopic spot image(s) were submitted for interpretation
post-operatively.

[Series 1: unknown protocol · 0.20mm/px · 3 of 3 slices shown]
[im 1/3]
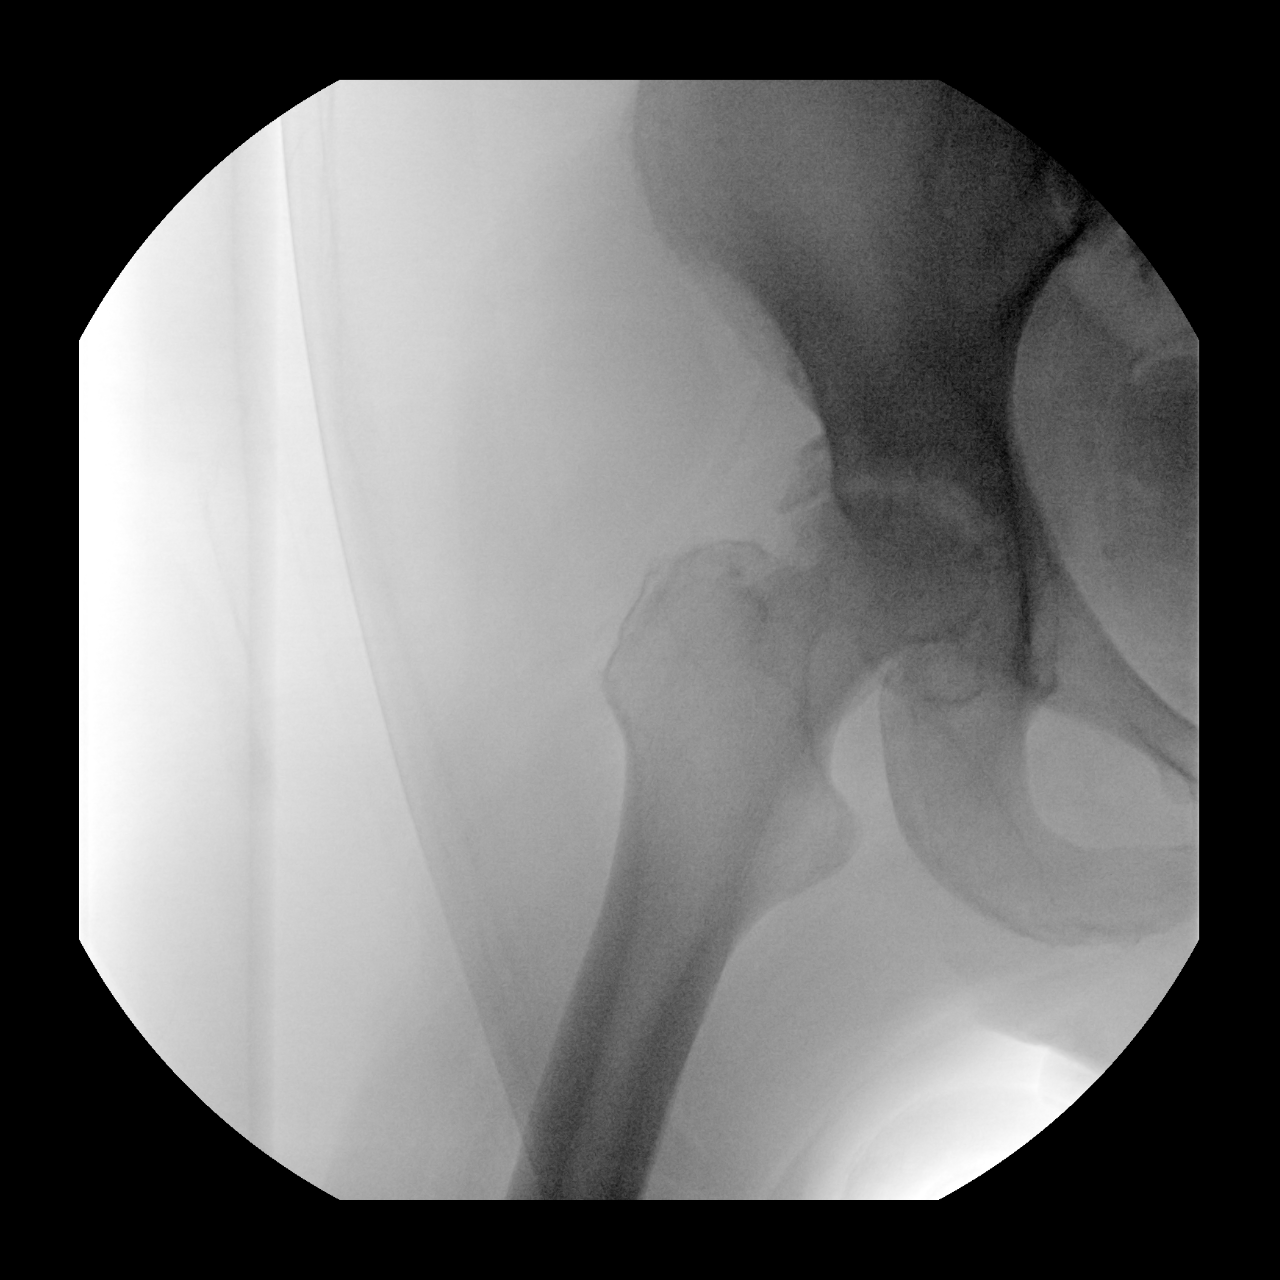
[im 2/3]
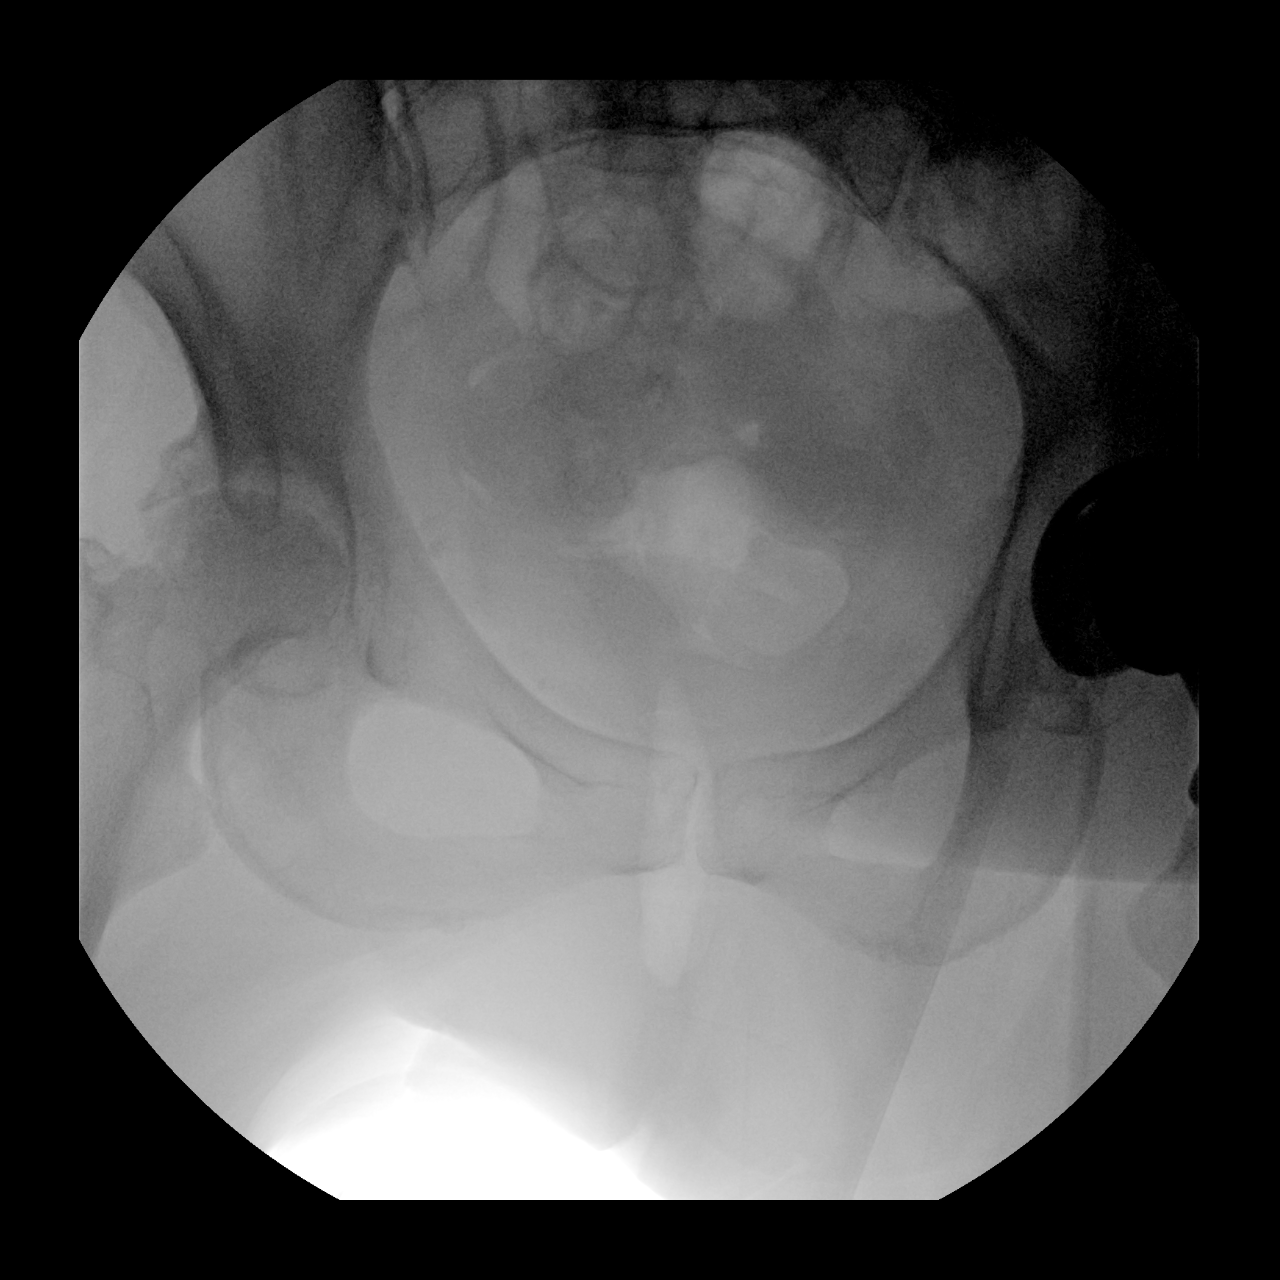
[im 3/3]
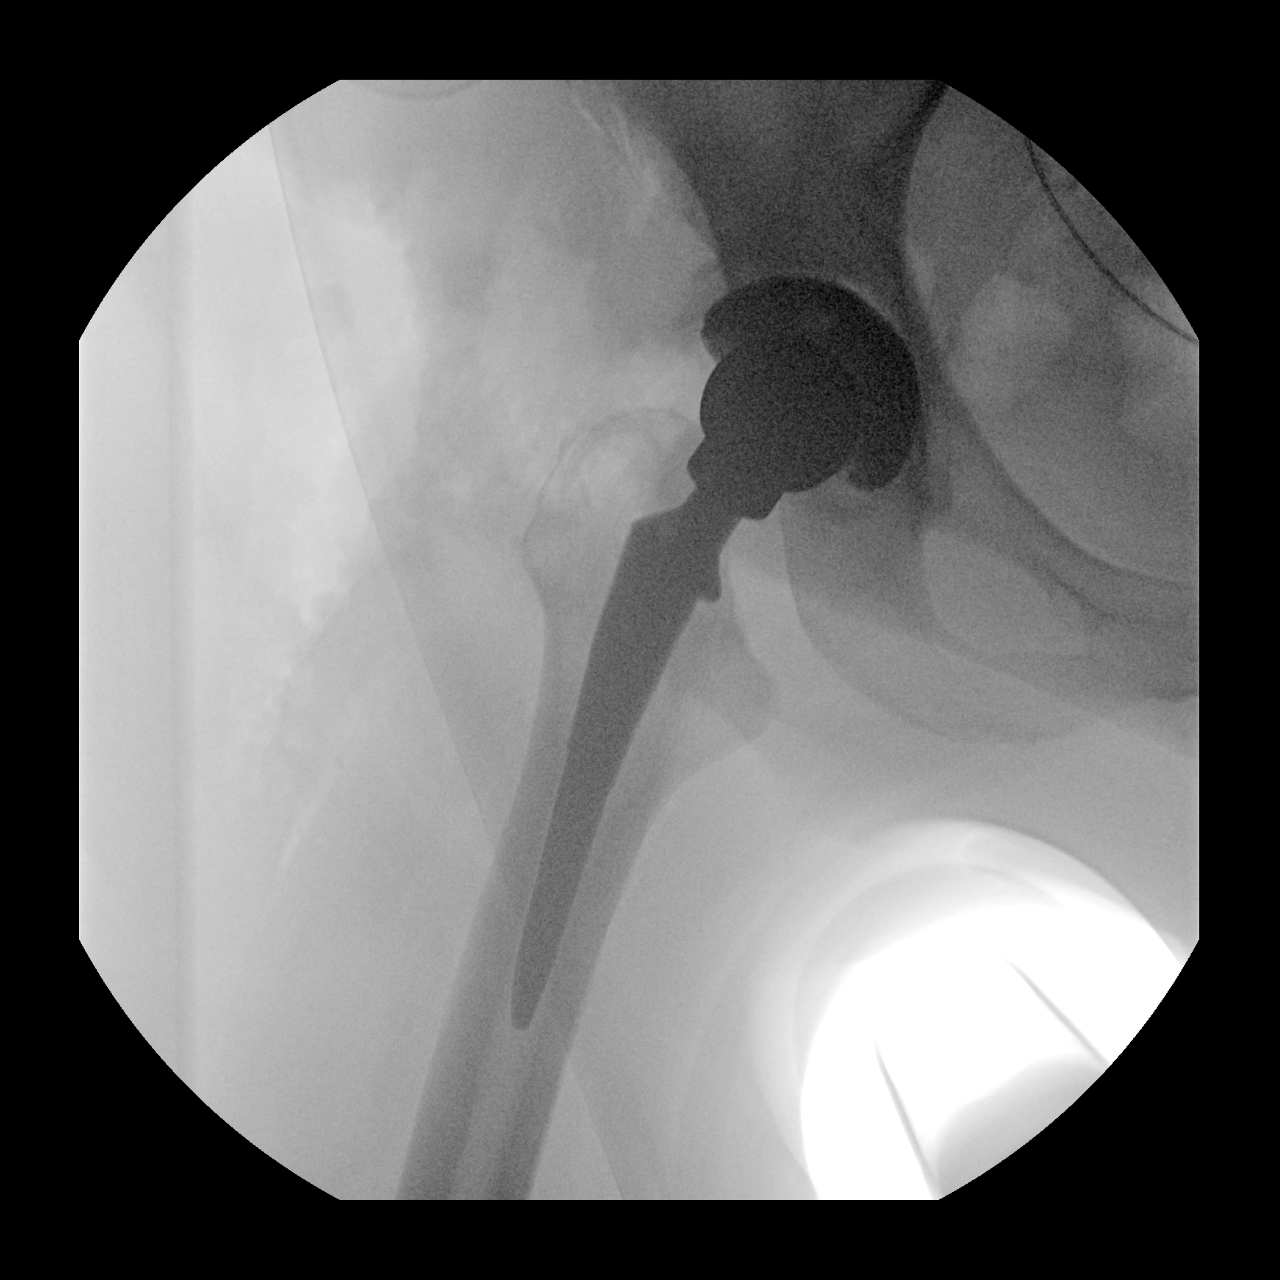

[3 of 3 positions shown; findings below may reference images not displayed]

FINDINGS: Right hip replacement in satisfactory position alignment. No
fracture or complication.

Pre-existing left hip replacement noted.
IMPRESSION: Satisfactory right hip replacement.

## 2022-01-31 IMAGING — DX DG PORTABLE PELVIS
1 series · 1 of 1 positions shown · non-contrast
Comparison: 05/11/2013

CLINICAL DATA: Status post right hip replacement

EXAM:
PORTABLE PELVIS 1-2 VIEWS

[pelvis ap]
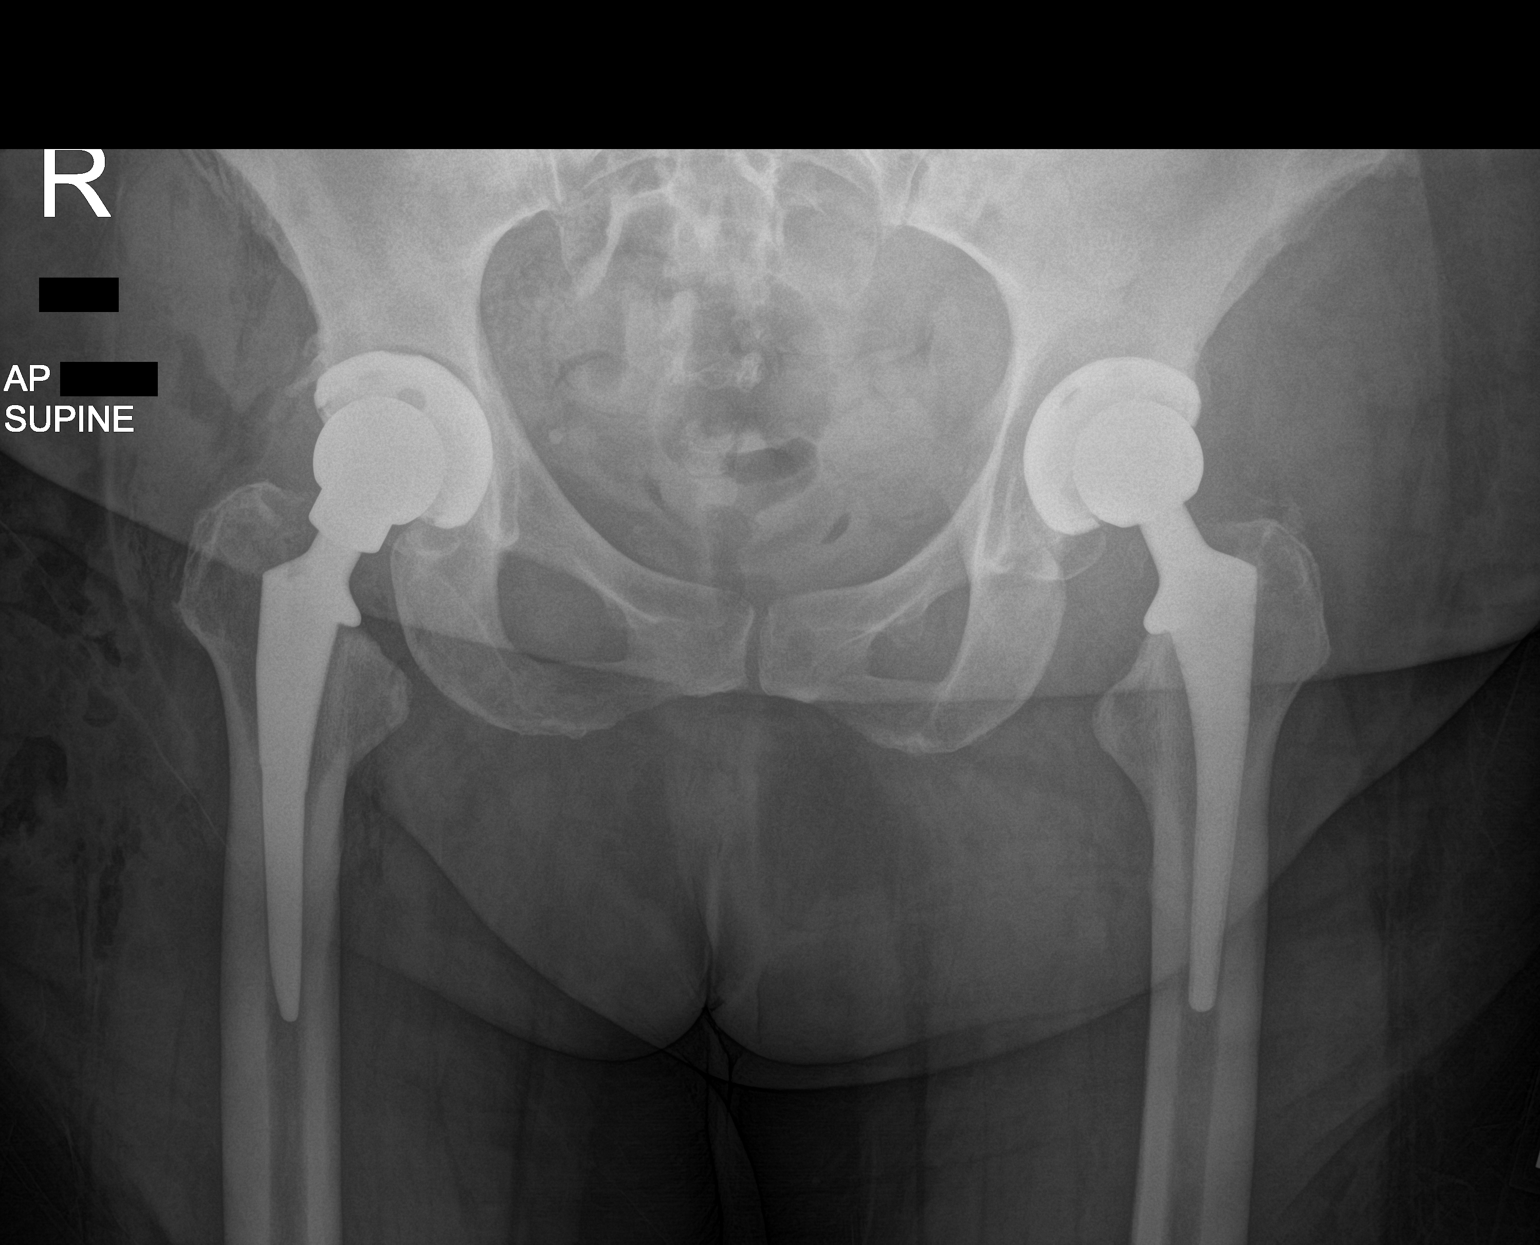

[1 of 1 positions shown; findings below may reference images not displayed]

FINDINGS: Normal placement of right hip arthroplasty. Total hip replacement in
satisfactory position alignment. No fracture or complication

Pre-existing left hip replacement without complication
IMPRESSION: Satisfactory right hip replacement.

## 2022-02-05 DIAGNOSIS — E7849 Other hyperlipidemia: Secondary | ICD-10-CM | POA: Diagnosis not present

## 2022-02-05 DIAGNOSIS — E1165 Type 2 diabetes mellitus with hyperglycemia: Secondary | ICD-10-CM | POA: Diagnosis not present

## 2022-02-05 DIAGNOSIS — G4733 Obstructive sleep apnea (adult) (pediatric): Secondary | ICD-10-CM | POA: Diagnosis not present

## 2022-02-05 DIAGNOSIS — R946 Abnormal results of thyroid function studies: Secondary | ICD-10-CM | POA: Diagnosis not present

## 2022-02-05 DIAGNOSIS — K2100 Gastro-esophageal reflux disease with esophagitis, without bleeding: Secondary | ICD-10-CM | POA: Diagnosis not present

## 2022-02-12 DIAGNOSIS — F331 Major depressive disorder, recurrent, moderate: Secondary | ICD-10-CM | POA: Diagnosis not present

## 2022-02-12 DIAGNOSIS — I10 Essential (primary) hypertension: Secondary | ICD-10-CM | POA: Diagnosis not present

## 2022-02-12 DIAGNOSIS — Z0001 Encounter for general adult medical examination with abnormal findings: Secondary | ICD-10-CM | POA: Diagnosis not present

## 2022-02-12 DIAGNOSIS — M1711 Unilateral primary osteoarthritis, right knee: Secondary | ICD-10-CM | POA: Diagnosis not present

## 2022-02-12 DIAGNOSIS — M545 Low back pain, unspecified: Secondary | ICD-10-CM | POA: Diagnosis not present

## 2022-02-12 DIAGNOSIS — E7849 Other hyperlipidemia: Secondary | ICD-10-CM | POA: Diagnosis not present

## 2022-02-12 DIAGNOSIS — E1165 Type 2 diabetes mellitus with hyperglycemia: Secondary | ICD-10-CM | POA: Diagnosis not present

## 2022-02-12 DIAGNOSIS — Z79891 Long term (current) use of opiate analgesic: Secondary | ICD-10-CM | POA: Diagnosis not present

## 2022-02-12 DIAGNOSIS — M19011 Primary osteoarthritis, right shoulder: Secondary | ICD-10-CM | POA: Diagnosis not present

## 2022-05-19 DIAGNOSIS — G4733 Obstructive sleep apnea (adult) (pediatric): Secondary | ICD-10-CM | POA: Diagnosis not present

## 2022-05-21 DIAGNOSIS — E7849 Other hyperlipidemia: Secondary | ICD-10-CM | POA: Diagnosis not present

## 2022-05-21 DIAGNOSIS — E1165 Type 2 diabetes mellitus with hyperglycemia: Secondary | ICD-10-CM | POA: Diagnosis not present

## 2022-05-21 DIAGNOSIS — K2100 Gastro-esophageal reflux disease with esophagitis, without bleeding: Secondary | ICD-10-CM | POA: Diagnosis not present

## 2022-05-21 DIAGNOSIS — I10 Essential (primary) hypertension: Secondary | ICD-10-CM | POA: Diagnosis not present

## 2022-05-26 DIAGNOSIS — I10 Essential (primary) hypertension: Secondary | ICD-10-CM | POA: Diagnosis not present

## 2022-05-26 DIAGNOSIS — M545 Low back pain, unspecified: Secondary | ICD-10-CM | POA: Diagnosis not present

## 2022-05-26 DIAGNOSIS — F331 Major depressive disorder, recurrent, moderate: Secondary | ICD-10-CM | POA: Diagnosis not present

## 2022-05-26 DIAGNOSIS — E7849 Other hyperlipidemia: Secondary | ICD-10-CM | POA: Diagnosis not present

## 2022-05-26 DIAGNOSIS — G4733 Obstructive sleep apnea (adult) (pediatric): Secondary | ICD-10-CM | POA: Diagnosis not present

## 2022-05-26 DIAGNOSIS — M19011 Primary osteoarthritis, right shoulder: Secondary | ICD-10-CM | POA: Diagnosis not present

## 2022-05-26 DIAGNOSIS — E1165 Type 2 diabetes mellitus with hyperglycemia: Secondary | ICD-10-CM | POA: Diagnosis not present

## 2022-05-26 DIAGNOSIS — M1711 Unilateral primary osteoarthritis, right knee: Secondary | ICD-10-CM | POA: Diagnosis not present

## 2022-05-26 DIAGNOSIS — Z79891 Long term (current) use of opiate analgesic: Secondary | ICD-10-CM | POA: Diagnosis not present

## 2022-06-02 DIAGNOSIS — M25511 Pain in right shoulder: Secondary | ICD-10-CM | POA: Diagnosis not present

## 2022-06-11 DIAGNOSIS — M25511 Pain in right shoulder: Secondary | ICD-10-CM | POA: Diagnosis not present

## 2022-06-18 DIAGNOSIS — M25511 Pain in right shoulder: Secondary | ICD-10-CM | POA: Diagnosis not present

## 2022-06-25 DIAGNOSIS — M25511 Pain in right shoulder: Secondary | ICD-10-CM | POA: Diagnosis not present

## 2022-06-30 DIAGNOSIS — M25511 Pain in right shoulder: Secondary | ICD-10-CM | POA: Diagnosis not present

## 2022-08-18 DIAGNOSIS — E1165 Type 2 diabetes mellitus with hyperglycemia: Secondary | ICD-10-CM | POA: Diagnosis not present

## 2022-08-18 DIAGNOSIS — R946 Abnormal results of thyroid function studies: Secondary | ICD-10-CM | POA: Diagnosis not present

## 2022-08-18 DIAGNOSIS — E7849 Other hyperlipidemia: Secondary | ICD-10-CM | POA: Diagnosis not present

## 2022-08-18 DIAGNOSIS — I10 Essential (primary) hypertension: Secondary | ICD-10-CM | POA: Diagnosis not present

## 2022-08-25 DIAGNOSIS — E1165 Type 2 diabetes mellitus with hyperglycemia: Secondary | ICD-10-CM | POA: Diagnosis not present

## 2022-08-25 DIAGNOSIS — D509 Iron deficiency anemia, unspecified: Secondary | ICD-10-CM | POA: Diagnosis not present

## 2022-08-25 DIAGNOSIS — F331 Major depressive disorder, recurrent, moderate: Secondary | ICD-10-CM | POA: Diagnosis not present

## 2022-08-25 DIAGNOSIS — M545 Low back pain, unspecified: Secondary | ICD-10-CM | POA: Diagnosis not present

## 2022-08-25 DIAGNOSIS — Z79891 Long term (current) use of opiate analgesic: Secondary | ICD-10-CM | POA: Diagnosis not present

## 2022-08-25 DIAGNOSIS — M19011 Primary osteoarthritis, right shoulder: Secondary | ICD-10-CM | POA: Diagnosis not present

## 2022-08-25 DIAGNOSIS — I10 Essential (primary) hypertension: Secondary | ICD-10-CM | POA: Diagnosis not present

## 2022-08-25 DIAGNOSIS — E7849 Other hyperlipidemia: Secondary | ICD-10-CM | POA: Diagnosis not present

## 2022-08-25 DIAGNOSIS — M1711 Unilateral primary osteoarthritis, right knee: Secondary | ICD-10-CM | POA: Diagnosis not present

## 2022-11-20 DIAGNOSIS — E7849 Other hyperlipidemia: Secondary | ICD-10-CM | POA: Diagnosis not present

## 2022-11-20 DIAGNOSIS — I10 Essential (primary) hypertension: Secondary | ICD-10-CM | POA: Diagnosis not present

## 2022-11-20 DIAGNOSIS — E1165 Type 2 diabetes mellitus with hyperglycemia: Secondary | ICD-10-CM | POA: Diagnosis not present

## 2022-11-20 LAB — LAB REPORT - SCANNED: EGFR: 56.4

## 2022-11-24 DIAGNOSIS — D509 Iron deficiency anemia, unspecified: Secondary | ICD-10-CM | POA: Diagnosis not present

## 2022-11-24 DIAGNOSIS — M19011 Primary osteoarthritis, right shoulder: Secondary | ICD-10-CM | POA: Diagnosis not present

## 2022-11-24 DIAGNOSIS — F331 Major depressive disorder, recurrent, moderate: Secondary | ICD-10-CM | POA: Diagnosis not present

## 2022-11-24 DIAGNOSIS — G4733 Obstructive sleep apnea (adult) (pediatric): Secondary | ICD-10-CM | POA: Diagnosis not present

## 2022-11-24 DIAGNOSIS — E7849 Other hyperlipidemia: Secondary | ICD-10-CM | POA: Diagnosis not present

## 2022-11-24 DIAGNOSIS — Z79891 Long term (current) use of opiate analgesic: Secondary | ICD-10-CM | POA: Diagnosis not present

## 2022-11-24 DIAGNOSIS — M1711 Unilateral primary osteoarthritis, right knee: Secondary | ICD-10-CM | POA: Diagnosis not present

## 2022-11-24 DIAGNOSIS — E1165 Type 2 diabetes mellitus with hyperglycemia: Secondary | ICD-10-CM | POA: Diagnosis not present

## 2022-11-24 DIAGNOSIS — I10 Essential (primary) hypertension: Secondary | ICD-10-CM | POA: Diagnosis not present

## 2022-11-28 DIAGNOSIS — R221 Localized swelling, mass and lump, neck: Secondary | ICD-10-CM | POA: Diagnosis not present

## 2022-11-28 DIAGNOSIS — J320 Chronic maxillary sinusitis: Secondary | ICD-10-CM | POA: Diagnosis not present

## 2022-12-16 DIAGNOSIS — M272 Inflammatory conditions of jaws: Secondary | ICD-10-CM | POA: Diagnosis not present

## 2022-12-18 ENCOUNTER — Telehealth: Payer: Self-pay

## 2022-12-18 ENCOUNTER — Other Ambulatory Visit: Payer: Self-pay

## 2022-12-18 ENCOUNTER — Other Ambulatory Visit (HOSPITAL_COMMUNITY): Payer: Self-pay

## 2022-12-18 ENCOUNTER — Ambulatory Visit (INDEPENDENT_AMBULATORY_CARE_PROVIDER_SITE_OTHER): Payer: Medicare PPO | Admitting: Infectious Diseases

## 2022-12-18 ENCOUNTER — Encounter: Payer: Self-pay | Admitting: Infectious Diseases

## 2022-12-18 VITALS — BP 127/74 | HR 82 | Temp 97.9°F | Ht 65.5 in | Wt 273.0 lb

## 2022-12-18 DIAGNOSIS — M272 Inflammatory conditions of jaws: Secondary | ICD-10-CM | POA: Diagnosis not present

## 2022-12-18 NOTE — Telephone Encounter (Signed)
Patient will need short stay for first dose. IR and patient notified.   Revonda Standard at short stay states that [facility] pharmacist will handle daptomycin dosing tomorrow once patient's creatinine results. Orders faxed to short stay, included that dapto should be dosed based on patient's IDEAL BODY WEIGHT (84 kg) per Rexene Alberts, NP.   Sandie Ano, RN

## 2022-12-18 NOTE — Progress Notes (Signed)
Patient: Theresa Horton  DOB: [birth year] MRN: [id number] PCP: Richardean Chimera, MD  Referring Provider: [facility]  Subjective   Subjective:    Discussed the use of AI scribe software for clinical note transcription with the patient, who gave verbal consent to proceed.  Theresa Horton is a 62 y.o. female is referred here for evaluation of CT scan concern for Osteomyelitis of the mandible - she was seen on 11/19 at [facility] with pain over the left lower canine that has been chronic, worsening. CT scan results revealed sclerosis of the left > right mandibular body and syphilis with associated periosteal reaction and lytic change along the inferior cortex of left anterior mandibular body suspicious for osteomyelitis; moderate surrounding edema and fat stranding involving the platysma and subcutaneous fat. Mildly prominent left-greater-than right level.    History of Present Illness   She has a history of dental abscesses treated with surgery and antibiotics x 2 over this past summer in July and August of this year. She has been having trouble with waxing and waning swelling and tingling sensation in the jaw on the left side.  The initial abscess was on the left side, which was treated and followed by a nerve canal procedure, which also resulted in an infection.  Despite this, the patient reported that the swelling started on the left side and gradually extended to the right side, including the bottom lip and chin. The patient denies any pain on opening the mouth fully, any drainage, or any pain while chewing food. There is no history of fevers or chills, even when the abscess was active. There is no open ulcers or exposed bone in the mouth around this area observed. She has had some chipped and broken teeth that need to be addressed but she cannot see the dentist until January since her dental insurance allotment has bene met.   The patient has been on  doxycycline and metronidazole since yesterday, with no reported side effects except for a change in the smell of urine with metronidazole. The patient has a known allergy to penicillin, but has tolerated cefalexin well in the past. She was taking amoxicillin and imodium at the same time and had very very low blood pressure. No severe allergy or symptoms of the skin. She has a family history of penicillin allergies. She also takes ibuprofen and hydrocodone for chronic back pain, following three back surgeries and bilateral hip replacements. There has been no reported flare-up of joint pains in the context of the current infection.    The patient lives with a grandson and has a history of self-administering B12 shots. She lives around [place], [place] about 3 miles from [facility] boarder.      Review of Systems  Constitutional:  Negative for chills and fever.  HENT:  Negative for ear pain.        +Facial swelling L     Past Medical History:  Diagnosis Date   Anxiety    Arthritis    Cancer (HCC)    hxof precancerous cervix- removed    Depression    Diabetes mellitus without complication (HCC)    hx of controlled with diet now    Dysrhythmia    palpitations; evaluated by cardiology in 2009; holter monitor showed NSR   GERD (gastroesophageal reflux disease)    Hypertension    hx of but had 100 lb weight loss    PONV (postoperative nausea and vomiting)  Sleep apnea    hx of no longer has to use CPAP     Outpatient Medications Prior to Visit  Medication Sig Dispense Refill   ARIPiprazole (ABILIFY) 5 MG tablet Take 5 mg by mouth at bedtime.     buPROPion (WELLBUTRIN XL) 300 MG 24 hr tablet Take 300 mg by mouth daily.     cyclobenzaprine (FLEXERIL) 10 MG tablet Take 10 mg by mouth 3 (three) times daily as needed.     docusate sodium (COLACE) 100 MG capsule Take 100 mg by mouth daily.     DULoxetine (CYMBALTA) 60 MG capsule Take 60 mg by mouth 2 (two) times daily. Pt takes both at nite per preop  apapt     gabapentin (NEURONTIN) 600 MG tablet Take 600 mg by mouth 3 (three) times daily as needed (Pain).     HYDROcodone-acetaminophen (NORCO/VICODIN) 5-325 MG tablet Take 1-2 tablets by mouth every 6 (six) hours as needed for severe pain. 42 tablet 0   losartan (COZAAR) 50 MG tablet Take 50 mg by mouth daily.     MELOXICAM PO Take 15 mg by mouth daily.     MOUNJARO 10 MG/0.5ML Pen Inject 10 mg into the skin once a week.     omeprazole (PRILOSEC) 20 MG capsule Take 20 mg by mouth daily as needed (Acid reflux).     simvastatin (ZOCOR) 20 MG tablet Take 20 mg by mouth daily.     methocarbamol (ROBAXIN) 500 MG tablet Take 1 tablet (500 mg total) by mouth every 6 (six) hours as needed for muscle spasms. (Patient not taking: Reported on 12/18/2022) 40 tablet 0   phentermine (ADIPEX-P) 37.5 MG tablet Take 37.5 mg by mouth daily. (Patient not taking: Reported on 12/18/2022)     rivaroxaban (XARELTO) 10 MG TABS tablet Take 1 tablet (10 mg total) by mouth daily with breakfast. Take for 20 days until completion of prescription, then take one 81 mg aspirin once a day for three weeks. Then discontinue aspirin. 20 tablet 0   Semaglutide, 1 MG/DOSE, (OZEMPIC, 1 MG/DOSE,) 2 MG/1.5ML SOPN Inject 1 mg into the skin every Tuesday. (Patient not taking: Reported on 12/18/2022)     No facility-administered medications prior to visit.     Allergies  Allergen Reactions   Lipitor [Atorvastatin]     Blood pressure dropped    Loperamide     Other Reaction(s): Unknown   Nsaids Swelling   Penicillins Hives   Sulfa Antibiotics     Mouth had blisters and thrush    Terbinafine And Related     Constipation    Glipizide Rash   Glyburide Rash   Nalfon [Fenoprofen Calcium] Swelling and Rash    Social History   Tobacco Use   Smoking status: Never   Smokeless tobacco: Never  Vaping Use   Vaping status: Never Used  Substance Use Topics   Alcohol use: Not Currently    Comment: occasional    Drug use: No     No family history on file.     Objective   Objective:   Vitals:   12/18/22 1251 12/18/22 1337  BP: (!) 140/82 127/74  Pulse: 82   Temp: 97.9 F (36.6 C)   TempSrc: Temporal   SpO2: 97%   Weight: 273 lb (123.8 kg)   Height: 5' 5.5" (1.664 m)    Body mass index is 44.74 kg/m.   Physical Exam   HEENT: No open wounds in oral cavity. Recurrent swelling. Palpable lymph node without  tenderness. Loss of a tooth and presence of a broken tooth.         Assessment & Plan:      Osteomyelitis of the Mandible, Left -  H/O Dental Abscesses - History of dental abscesses and recent CT scan suggestive of osteomyelitis. No current pain or drainage, but persistent swelling and numbness that is present on exam today L>R.  She has no trismus or tenderness with chewing. No open areas or exposed bone on gumline observed.  Her history and waxing/waning swelling all compelling for chronic osteomyelitis. Currently on oral antibiotics (doxycycline and metronidazole) prescribed by PCP.  -Continue oral antibiotics (doxycycline and metronidazole) until PICC line placement. -See IV OPAT orders below -Coordinate with home health team for PICC line care, medication administration, and monitoring. -Order baseline blood work for kidney and liver function, white blood cell count,  CRP, ESR today  -Plan for follow-up appointment in approximately 3 weeks to assess response to treatment. -Consider need for surgical intervention if not responding to antibiotic therapy - she is not currently able to see dentistry until January d/t out of pocket cost.  -I told her we need to still communicate with her dentist regarding CT scan results and treatment plan in the event we need surgical assistance managing this.    H/O PCN Allergy -  Non-severe allergic reaction that I am not even sure is related to PCN. Would feel comfortable with amoxicillin challenge for her if she is willing. Has tolerated Cephalexin in the  past w/o trouble.      Problem List Items Addressed This Visit   None Visit Diagnoses     Osteomyelitis of mandible    -  Primary   Relevant Orders   C-reactive protein   Sedimentation rate   CBC   IR PICC PLACEMENT RIGHT >5 YRS INC IMG GUIDE   CK       OPAT ORDERS:  Diagnosis: Mandibular Osteomyelitis   Culture Result: none available   Allergies  Allergen Reactions   Lipitor [Atorvastatin]     Blood pressure dropped    Loperamide     Other Reaction(s): Unknown   Nsaids Swelling   Penicillins Hives   Sulfa Antibiotics     Mouth had blisters and thrush    Terbinafine And Related     Constipation    Glipizide Rash   Glyburide Rash   Nalfon [Fenoprofen Calcium] Swelling and Rash    Aantibiotics to be given via PICC line:  Daptomycin - dose pending today's labs 8 mg/kg IDBW preferred (750 mg Q24h)  + Ceftriaxone 2 gm IV daily   +  ORAL metronidazole 500 mg BID    Duration: 6 weeks   End Date: TBD pending placement and first doses   PIC Care Per Protocol with Biopatch Use: Home health RN for IV administration and teaching, line care and labs.    Labs weekly while on IV antibiotics: _x_ CBC with differential __ BMP **TWICE WEEKLY ON VANCOMYCIN  _x_ CMP __ CRP _x_ ESR __ Vancomycin trough TWICE WEEKLY _x_ CK  _x_ Please pull PIC at completion of IV antibiotics __ Please leave PIC in place until doctor has seen patient or been notified  Fax weekly labs to [phone]     Orders Placed This Encounter  Procedures   IR PICC PLACEMENT RIGHT >5 YRS INC IMG GUIDE    Standing Status:   Future    Standing Expiration Date:   12/18/2023    Order Specific Question:  Reason for Exam (SYMPTOM  OR DIAGNOSIS REQUIRED)    Answer:   mandibular osteomyelitis, outpatient antibiotics therapy    Order Specific Question:   Preferred Imaging Location?    Answer:   [facility]   C-reactive protein   Sedimentation rate   CBC   CK    Standing Status:    Future    Standing Expiration Date:   12/18/2023    No orders of the defined types were placed in this encounter.   Return in about 3 weeks (around 01/08/2023).   Rexene Alberts, MSN, NP-C [facility] for Infectious Disease [facility]  [email or web address] Pager: [phone] Office: [phone] [facility] Main Line: [phone] *Secure Chat Communication Welcome

## 2022-12-18 NOTE — Telephone Encounter (Signed)
New OPAT orders per Rexene Alberts, NP. Orders sent to Jeri Modena, RN with [employer]. PICC placement scheduled for 12/19/22 2:00 PM at [facility]. Appointment info given to patient.   Patient lives in [place], awaiting response from [place] regarding first dose.   Sandie Ano, RN  OPAT ORDERS:   Diagnosis: Mandibular Osteomyelitis    Culture Result: none available       Antibiotics to be given via PICC line:   Daptomycin - dose pending today's labs 8 mg/kg IDBW preferred (750 mg Q24h)             + Ceftriaxone 2 gm IV daily              +  ORAL metronidazole 500 mg BID      Duration: 6 weeks    End Date: TBD pending placement and first doses    PIC Care Per Protocol with Biopatch Use: Home health RN for IV administration and teaching, line care and labs.     Labs weekly while on IV antibiotics: _x_ CBC with differential __ BMP **TWICE WEEKLY ON VANCOMYCIN  _x_ CMP __ CRP _x_ ESR __ Vancomycin trough TWICE WEEKLY _x_ CK   _x_ Please pull PIC at completion of IV antibiotics __ Please leave PIC in place until doctor has seen patient or been notified   Fax weekly labs to [phone]

## 2022-12-18 NOTE — Patient Instructions (Addendum)
Nice to meet you   For the infection I would recommend we transition the antibiotics to IV.  Daptomycin and Ceftriaxone will be the plan   Reduce the metronidazole to twice a day - you will continue taking this everyday until we are done with the IV antibiotics.   Continue  the doxycycline twice a day together for now until we get the IV antibiotics started   Please call me with the name and phone number of your dentist please - I would like for them to know what's going on. If we start to worry that you are failing treatment with antibiotics alone and need to consider if surgery is necessary I would like for them to be in the know.    Please come back to see [place] in about 3-4 weeks to see one of our doctors.   PICC Placement  [facility] Entrance A First Floor Interventional Radiology  11/22 at 1:45 PM

## 2022-12-19 ENCOUNTER — Encounter (HOSPITAL_COMMUNITY)
Admission: RE | Admit: 2022-12-19 | Discharge: 2022-12-19 | Disposition: A | Discharge status: Home / Self Care | Payer: Medicare PPO | Source: Ambulatory Visit | Attending: Infectious Diseases | Admitting: Infectious Diseases

## 2022-12-19 ENCOUNTER — Ambulatory Visit (HOSPITAL_COMMUNITY)
Admission: RE | Admit: 2022-12-19 | Discharge: 2022-12-19 | Disposition: A | Discharge status: Home / Self Care | Payer: Medicare PPO | Source: Ambulatory Visit | Attending: Infectious Diseases | Admitting: Infectious Diseases

## 2022-12-19 ENCOUNTER — Other Ambulatory Visit (HOSPITAL_COMMUNITY): Payer: Self-pay

## 2022-12-19 DIAGNOSIS — M869 Osteomyelitis, unspecified: Secondary | ICD-10-CM | POA: Diagnosis not present

## 2022-12-19 DIAGNOSIS — M272 Inflammatory conditions of jaws: Secondary | ICD-10-CM | POA: Insufficient documentation

## 2022-12-19 LAB — CBC
HCT: 38.3 % (ref 35.0–45.0)
Hemoglobin: 12.2 g/dL (ref 11.7–15.5)
MCH: 27.8 pg (ref 27.0–33.0)
MCHC: 31.9 g/dL — ABNORMAL LOW (ref 32.0–36.0)
MCV: 87.2 fL (ref 80.0–100.0)
MPV: 8.8 fL (ref 7.5–12.5)
Platelets: 341 10*3/uL (ref 140–400)
RBC: 4.39 10*6/uL (ref 3.80–5.10)
RDW: 13.5 % (ref 11.0–15.0)
WBC: 5.4 10*3/uL (ref 3.8–10.8)

## 2022-12-19 LAB — C-REACTIVE PROTEIN: CRP: <3 mg/L (ref <8.0)

## 2022-12-19 LAB — CK: Total CK: 81 U/L (ref 29–143)

## 2022-12-19 LAB — SEDIMENTATION RATE: Sed Rate: 14 mm/h (ref 0–30)

## 2022-12-19 MED ORDER — HEPARIN SOD (PORK) LOCK FLUSH 100 UNIT/ML IV SOLN
250.0000 [IU] | INTRAVENOUS | Status: AC | PRN
  Administered 2022-12-19: 250 [IU]

## 2022-12-19 MED ORDER — LIDOCAINE HCL 1 % IJ SOLN
20.0000 mL | Freq: Once | INTRAMUSCULAR | Status: AC
  Administered 2022-12-19: 10 mL

## 2022-12-19 MED ORDER — HEPARIN SOD (PORK) LOCK FLUSH 100 UNIT/ML IV SOLN
INTRAVENOUS | Status: AC
  Filled 2022-12-19: qty 5

## 2022-12-19 MED ORDER — SODIUM CHLORIDE 0.9 % IV SOLN
2.0000 g | Freq: Once | INTRAVENOUS | Status: AC
  Administered 2022-12-19: 2 g via INTRAVENOUS
  Filled 2022-12-19: qty 20

## 2022-12-19 MED ORDER — LIDOCAINE HCL 1 % IJ SOLN
INTRAMUSCULAR | Status: AC
Start: 2022-12-19 — End: ?
  Filled 2022-12-19: qty 20

## 2022-12-19 MED ORDER — SODIUM CHLORIDE 0.9 % IV SOLN
750.0000 mg | Freq: Every day | INTRAVENOUS | Status: AC
  Administered 2022-12-19: 800 mg via INTRAVENOUS
  Filled 2022-12-19: qty 16

## 2022-12-19 NOTE — Telephone Encounter (Signed)
Confirmed with Delphia Grates, RN at short stay that they received updated orders.   Sandie Ano, RN

## 2022-12-19 NOTE — Procedures (Signed)
PROCEDURE SUMMARY:  Successful placement of single lumen PICC line to right brachial vein. Length 40 cm Tip at lower SVC/RA PICC capped No complications Ready for use  EBL < 5 mL   Elverta Dimiceli H Boykin Baetz PA-C 12/19/2022, 2:25 PM

## 2022-12-19 NOTE — Telephone Encounter (Signed)
Creatinine from 11/20/22 was 1.10, per Rexene Alberts, NP, daptomycin dose should be 750 mg daily. Updated orders faxed to short stay, spoke with Natelia at short stay and notified her that updated orders were sent.   Sandie Ano, RN

## 2022-12-20 DIAGNOSIS — M869 Osteomyelitis, unspecified: Secondary | ICD-10-CM | POA: Diagnosis not present

## 2022-12-23 DIAGNOSIS — M869 Osteomyelitis, unspecified: Secondary | ICD-10-CM | POA: Diagnosis not present

## 2022-12-24 DIAGNOSIS — M869 Osteomyelitis, unspecified: Secondary | ICD-10-CM | POA: Diagnosis not present

## 2022-12-30 ENCOUNTER — Telehealth: Payer: Self-pay

## 2022-12-30 NOTE — Telephone Encounter (Signed)
I called and left a voicemail for Hendrix to return my call to investigate further (our triage team was actually on the phone with her already)   I called Angie at [employer] to get an update -  She reported a rash on the backs of both hands that was extending up to arms (Friday). Overnight it spread to thighs and became itchy. Home health team recommended she come to ER for evaluation however she declined. She did hold her ceftriaxone Sunday and Monday and says the swelling in arms has improved. She denied any oral swelling, blistering, or peeling skin; no fevers.   Will add Ceftriaxone to allergy list and stop this. She can continue with oral benadryl until improvement. Would also add non-scented lotion to help with skin integrity and itch/over drying.  Will switch to Invanz 1 gm IV Q24h. She can also stop the metronidazole given the invanz will cover anaerobes adequately.  She has an appt with Dr. Elinor Parkinson this week as well to follow up and ensure ongoing improvement.   Will need to request labs to be faxed from home health team -  [facility] [phone]    Rexene Alberts, MSN, NP-C [facility] for Infectious Disease [facility]  [email or web address] Pager: [phone] Office: [phone] [facility] Main Line: [phone] *Secure Chat Communication Welcome

## 2022-12-30 NOTE — Telephone Encounter (Signed)
Received voicemail from Angie with [employer] regarding reaction to antibiotic. Called [employer] and spoke with Amy who states patient reported rash. Rash does not itch. Denies chest pain. Patient declined ED recommendation.  Patient would like call back as soon as possible to discuss her concern. [facility] would like call back as well. Angie([employer])  [phone] Juanita Laster, RMA

## 2022-12-31 DIAGNOSIS — M869 Osteomyelitis, unspecified: Secondary | ICD-10-CM | POA: Diagnosis not present

## 2023-01-01 ENCOUNTER — Ambulatory Visit: Payer: Medicare PPO | Admitting: Infectious Diseases

## 2023-01-01 DIAGNOSIS — M869 Osteomyelitis, unspecified: Secondary | ICD-10-CM | POA: Diagnosis not present

## 2023-01-01 NOTE — Telephone Encounter (Signed)
Called to check on patient as I haven't heard anything back from [facility]. Patient stated that a nurse will be out tomorrow at 10 AM to administer first dose. Patient aware to keep follow up with Dr.Vu on 12/12.

## 2023-01-01 NOTE — Telephone Encounter (Signed)
Patient was contacted by our office to reschedule appointment today due to Dr.Manandhar being out of the office. Patient stated that she received IV ernapenem today. Patient stated that a nurse was supposed to show her how to administer IV medication and to be there for first dose. Patient stated that her nurse came out yesterday but patient didn't have IV med then.   Patient stated that the swelling in arms and itchy skin has improved.   Contacted [facility], spoke to Brice, [place] - informed her patient will need nurse to come out to administer first dose of IV ertapenem. Angie stated that they are going to work on getting a nurse to go out to assist patient and will call me back.   Arman Loy Lesli Albee, CMA

## 2023-01-02 DIAGNOSIS — M869 Osteomyelitis, unspecified: Secondary | ICD-10-CM | POA: Diagnosis not present

## 2023-01-05 NOTE — Addendum Note (Signed)
Encounter addended by: Edward Qualia on: 01/05/2023 1:51 PM  Actions taken: Imaging Exam ended

## 2023-01-07 DIAGNOSIS — M869 Osteomyelitis, unspecified: Secondary | ICD-10-CM | POA: Diagnosis not present

## 2023-01-08 ENCOUNTER — Telehealth: Payer: Self-pay

## 2023-01-08 ENCOUNTER — Encounter: Payer: Self-pay | Admitting: Internal Medicine

## 2023-01-08 ENCOUNTER — Ambulatory Visit (INDEPENDENT_AMBULATORY_CARE_PROVIDER_SITE_OTHER): Payer: Medicare PPO | Admitting: Internal Medicine

## 2023-01-08 ENCOUNTER — Other Ambulatory Visit: Payer: Self-pay

## 2023-01-08 VITALS — BP 129/84 | HR 85 | Temp 97.9°F | Ht 65.5 in | Wt 268.0 lb

## 2023-01-08 DIAGNOSIS — M272 Inflammatory conditions of jaws: Secondary | ICD-10-CM

## 2023-01-08 MED ORDER — DOXYCYCLINE HYCLATE 100 MG PO TABS
100.0000 mg | ORAL_TABLET | Freq: Two times a day (BID) | ORAL | 0 refills | Status: AC
Start: 2023-01-08 — End: 2023-02-07

## 2023-01-08 NOTE — Progress Notes (Signed)
[facility]  Patient Active Problem List   Diagnosis Date Noted   S/P total right hip arthroplasty 09/26/2020   OA (osteoarthritis) of hip 05/11/2013   HYPERCHOLESTEROLEMIA  IIA 11/09/2008   Overweight 11/09/2008   Essential hypertension 11/09/2008   PALPITATIONS 11/09/2008   ABNORMAL EKG 11/09/2008      Subjective:    Patient ID: Theresa Horton, female    DOB: September 27, 1960, 62 y.o.   MRN: [id number]  Chief Complaint  Patient presents with   Follow-up    HPI:  Theresa Horton is a 62 y.o. female here for f/u jaw osteomyelitis   She was started on dapto/ceftriaxone/flagyl 12/18/22 She developed itchy rash and hands swelling so switched to ertapenem and daptomycin with improvement. Switch was done 12/30/22   01/08/23 id f/u She is doing better No itch/rash No jaw pain Reviewed opat from 01/07/23 Cbc 7/12/439; cr 0.97; lft normal Cpk 101 Sed rate 48 12/31/22 sed rate 15  6 weeks eot would be on 01/29/23 She is eating oral normal texture  She will see dental on 01/30/23    Allergies  Allergen Reactions   Lipitor [Atorvastatin]     Blood pressure dropped    Loperamide     Other Reaction(s): Unknown   Nsaids Swelling   Penicillins Hives   Sulfa Antibiotics     Mouth had blisters and thrush    Terbinafine And Related     Constipation    Ceftriaxone Rash    Itching rash spreading on arms and thighs after a week of CTX at home. Previously had tolerated PO cephalexin fine.    Glipizide Rash   Glyburide Rash   Nalfon [Fenoprofen Calcium] Swelling and Rash      Outpatient Medications Prior to Visit  Medication Sig Dispense Refill   ARIPiprazole (ABILIFY) 5 MG tablet Take 5 mg by mouth at bedtime.     buPROPion (WELLBUTRIN XL) 300 MG 24 hr tablet Take 300 mg by mouth daily.     cyclobenzaprine (FLEXERIL) 10 MG tablet Take 10 mg by mouth 3 (three) times daily as needed.     docusate sodium (COLACE) 100 MG capsule Take 100  mg by mouth daily.     DULoxetine (CYMBALTA) 60 MG capsule Take 60 mg by mouth 2 (two) times daily. Pt takes both at nite per preop apapt     gabapentin (NEURONTIN) 600 MG tablet Take 600 mg by mouth 3 (three) times daily as needed (Pain).     HYDROcodone-acetaminophen (NORCO/VICODIN) 5-325 MG tablet Take 1-2 tablets by mouth every 6 (six) hours as needed for severe pain. 42 tablet 0   losartan (COZAAR) 50 MG tablet Take 50 mg by mouth daily.     MELOXICAM PO Take 15 mg by mouth daily.     methocarbamol (ROBAXIN) 500 MG tablet Take 1 tablet (500 mg total) by mouth every 6 (six) hours as needed for muscle spasms. 40 tablet 0   MOUNJARO 10 MG/0.5ML Pen Inject 10 mg into the skin once a week.     omeprazole (PRILOSEC) 20 MG capsule Take 20 mg by mouth daily as needed (Acid reflux).     rivaroxaban (XARELTO) 10 MG TABS tablet Take 1 tablet (10 mg total) by mouth daily with breakfast. Take for 20 days until completion of prescription, then take one 81 mg aspirin once a day for three weeks. Then discontinue aspirin. 20 tablet 0   simvastatin (ZOCOR) 20 MG tablet  Take 20 mg by mouth daily.     phentermine (ADIPEX-P) 37.5 MG tablet Take 37.5 mg by mouth daily. (Patient not taking: Reported on 12/18/2022)     Semaglutide, 1 MG/DOSE, (OZEMPIC, 1 MG/DOSE,) 2 MG/1.5ML SOPN Inject 1 mg into the skin every Tuesday. (Patient not taking: Reported on 12/18/2022)     No facility-administered medications prior to visit.     Social History   Socioeconomic History   Marital status: Single    Spouse name: Not on file   Number of children: 1   Years of education: Not on file   Highest education level: Not on file  Occupational History   Not on file  Tobacco Use   Smoking status: Never   Smokeless tobacco: Never  Vaping Use   Vaping status: Never Used  Substance and Sexual Activity   Alcohol use: Not Currently    Comment: occasional    Drug use: No   Sexual activity: Not on file  Other Topics Concern    Not on file  Social History Narrative   Not on file   Social Drivers of Health   Financial Resource Strain: Low Risk  (10/05/2017)   Received from [facility], [facility]   Overall Financial Resource Strain (CARDIA)    Difficulty of Paying Living Expenses: Not hard at all  Food Insecurity: No Food Insecurity (10/05/2017)   Received from [facility], [facility]   Hunger Vital Sign    Worried About Running Out of Food in the Last Year: Never true    Ran Out of Food in the Last Year: Never true  Transportation Needs: No Transportation Needs (10/05/2017)   Received from [facility], [facility]   [facility] - Transportation    Lack of Transportation (Medical): No    Lack of Transportation (Non-Medical): No  Physical Activity: Inactive (10/05/2017)   Received from [facility], [facility]   Exercise Vital Sign    Days of Exercise per Week: 0 days    Minutes of Exercise per Session: 0 min  Stress: Not on file  Social Connections: Not on file  Intimate Partner Violence: Not At Risk (10/05/2017)   Received from [facility], [facility]   Humiliation, Afraid, Rape, and Kick questionnaire    Fear of Current or Ex-Partner: No    Emotionally Abused: No    Physically Abused: No    Sexually Abused: No      Review of Systems    All other ros negative   Objective:    BP 129/84   Pulse 85   Temp 97.9 F (36.6 C) (Temporal)   Ht 5' 5.5" (1.664 m)   Wt 268 lb (121.6 kg)   SpO2 97%   BMI 43.92 kg/m  Nursing note and vital signs reviewed.  Physical Exam     General/constitutional: no distress, pleasant HEENT: Normocephalic, PER, Conj Clear, EOMI, Oropharynx clear; missing teeth; there is a black surfaced tooth with thinned out dentin but no tenderness or oozing near that tooth; nontender jaw line Neck supple CV: rrr no mrg Lungs: clear to auscultation, normal respiratory effort Abd: Soft, Nontender Ext: no edema Skin: No Rash Neuro:  nonfocal MSK: no peripheral joint swelling/tenderness/warmth; back spines nontender   Central line presence: rue picc site no erythema/purulence   Labs: See opat labs above   Micro:  Serology:  Imaging:  Assessment & Plan:   Problem List Items Addressed This Visit  None Visit Diagnoses       Acute osteomyelitis of jaw    -  Primary         No orders of the defined types were placed in this encounter.    Esr a little bit up but she feels better with jawline pain and no further allergy sx Discussed switching dapto to doxy as the latter has some activity against actino if it was present    -continue ertapenem until 01/29/23 -stop daptomycin today -will notify hh -start doxycline 100 mg bid (avoid dairy, calcium/magnesium/iron within 2 hrs taking doxy; wears sunscreen) -eot still on 01/29/23 -if concern about ongoing infection then, will use doxy/levoflox (allegic to pcn and reaction to ceftriaxone) -f/u around first week of January with either me or Rexene Alberts -can keep picc in until seen by [place] again     Follow-up: Return in about 3 weeks (around 01/29/2023).      Raymondo Band, MD [facility] 01/08/2023, 1:16 PM

## 2023-01-08 NOTE — Patient Instructions (Signed)
Stop daptomycin  Start pill doxycycline today (avoid dairy, calcium, magnesium, iron within 2 hours taking doxy); also wears sunscreen when exposed to sun   Continue ertepenem  Tentative stop date is 01/29/23; but we might need longer   See [place] first week of jan 2025

## 2023-01-08 NOTE — Telephone Encounter (Signed)
Per Rodney Booze she reports per Dr. Renold Don would like for patient to stop the daptomycin and continue ertapenem until 01/29/23.  Per Rodney Booze she has informed [employer] of the orders as well. [place] Masaki Jonathon Resides, CMA

## 2023-01-09 DIAGNOSIS — M869 Osteomyelitis, unspecified: Secondary | ICD-10-CM | POA: Diagnosis not present

## 2023-01-10 DIAGNOSIS — M869 Osteomyelitis, unspecified: Secondary | ICD-10-CM | POA: Diagnosis not present

## 2023-01-14 DIAGNOSIS — M869 Osteomyelitis, unspecified: Secondary | ICD-10-CM | POA: Diagnosis not present

## 2023-01-16 DIAGNOSIS — M869 Osteomyelitis, unspecified: Secondary | ICD-10-CM | POA: Diagnosis not present

## 2023-01-17 DIAGNOSIS — G4733 Obstructive sleep apnea (adult) (pediatric): Secondary | ICD-10-CM | POA: Diagnosis not present

## 2023-01-22 DIAGNOSIS — M869 Osteomyelitis, unspecified: Secondary | ICD-10-CM | POA: Diagnosis not present

## 2023-01-27 DIAGNOSIS — M869 Osteomyelitis, unspecified: Secondary | ICD-10-CM | POA: Diagnosis not present

## 2023-01-28 DIAGNOSIS — M869 Osteomyelitis, unspecified: Secondary | ICD-10-CM | POA: Diagnosis not present

## 2023-01-29 ENCOUNTER — Other Ambulatory Visit: Payer: Self-pay

## 2023-01-29 ENCOUNTER — Ambulatory Visit: Payer: Medicare PPO | Admitting: Internal Medicine

## 2023-01-29 VITALS — BP 134/85 | HR 82 | Resp 16 | Ht 65.5 in | Wt 267.9 lb

## 2023-01-29 DIAGNOSIS — M869 Osteomyelitis, unspecified: Secondary | ICD-10-CM | POA: Diagnosis not present

## 2023-01-29 DIAGNOSIS — Z880 Allergy status to penicillin: Secondary | ICD-10-CM

## 2023-01-29 NOTE — Progress Notes (Addendum)
 [facility]  Patient Active Problem List   Diagnosis Date Noted   S/P total right hip arthroplasty 09/26/2020   OA (osteoarthritis) of hip 05/11/2013   HYPERCHOLESTEROLEMIA  IIA 11/09/2008   Overweight 11/09/2008   Essential hypertension 11/09/2008   PALPITATIONS 11/09/2008   ABNORMAL EKG 11/09/2008      Subjective:    Patient ID: Theresa Horton, female    DOB: 1960-08-26, 63 y.o.   MRN: [id number]  No chief complaint on file.  Cc- om jaw  HPI:  Theresa Horton is a 63 y.o. female here for f/u jaw osteomyelitis  She had a ct scan 12/15/21 for pain over left lower canine at her family medicine clinic, and that shows sclerotic change left > right mandibular body with some lytic change of the left anterior mandibular body. Earlier in the year had dental abscesses s/p abx tx twice    She was started on dapto/ceftriaxone /flagyl 12/18/22 at initial rcid visit She developed itchy rash and hands swelling so switched to ertapenem and daptomycin  with improvement. Switch was done 12/30/22   01/08/23 id f/u She is doing better No itch/rash No jaw pain Reviewed opat from 01/07/23 Cbc 7/12/439; cr 0.97; lft normal Cpk 101 Sed rate 48 12/31/22 sed rate 15  6 weeks eot would be on 01/29/23 She is eating oral normal texture  She will see dental on 01/30/23   ---- 01/29/23 id clinic f/u See a&P for detail  Allergies  Allergen Reactions   Lipitor [Atorvastatin]     Blood pressure dropped    Loperamide     Other Reaction(s): Unknown   Nsaids Swelling   Penicillins Hives   Sulfa Antibiotics     Mouth had blisters and thrush    Terbinafine And Related     Constipation    Ceftriaxone  Rash    Itching rash spreading on arms and thighs after a week of CTX at home. Previously had tolerated PO cephalexin fine.    Glipizide Rash   Glyburide Rash   Nalfon [Fenoprofen Calcium] Swelling and Rash      Outpatient Medications Prior to Visit   Medication Sig Dispense Refill   ARIPiprazole  (ABILIFY ) 5 MG tablet Take 5 mg by mouth at bedtime.     buPROPion  (WELLBUTRIN  XL) 300 MG 24 hr tablet Take 300 mg by mouth daily.     cyclobenzaprine  (FLEXERIL ) 10 MG tablet Take 10 mg by mouth 3 (three) times daily as needed.     docusate sodium  (COLACE) 100 MG capsule Take 100 mg by mouth daily.     doxycycline  (VIBRA -TABS) 100 MG tablet Take 1 tablet (100 mg total) by mouth 2 (two) times daily. 60 tablet 0   DULoxetine  (CYMBALTA ) 60 MG capsule Take 60 mg by mouth 2 (two) times daily. Pt takes both at nite per preop apapt     gabapentin  (NEURONTIN ) 600 MG tablet Take 600 mg by mouth 3 (three) times daily as needed (Pain).     HYDROcodone -acetaminophen  (NORCO/VICODIN) 5-325 MG tablet Take 1-2 tablets by mouth every 6 (six) hours as needed for severe pain. 42 tablet 0   losartan  (COZAAR ) 50 MG tablet Take 50 mg by mouth daily.     MELOXICAM PO Take 15 mg by mouth daily.     methocarbamol  (ROBAXIN ) 500 MG tablet Take 1 tablet (500 mg total) by mouth every 6 (six) hours as needed for muscle spasms. 40 tablet 0   MOUNJARO 10 MG/0.5ML  Pen Inject 10 mg into the skin once a week.     omeprazole  (PRILOSEC) 20 MG capsule Take 20 mg by mouth daily as needed (Acid reflux).     phentermine (ADIPEX-P) 37.5 MG tablet Take 37.5 mg by mouth daily. (Patient not taking: Reported on 12/18/2022)     rivaroxaban  (XARELTO ) 10 MG TABS tablet Take 1 tablet (10 mg total) by mouth daily with breakfast. Take for 20 days until completion of prescription, then take one 81 mg aspirin once a day for three weeks. Then discontinue aspirin. 20 tablet 0   Semaglutide, 1 MG/DOSE, (OZEMPIC, 1 MG/DOSE,) 2 MG/1.5ML SOPN Inject 1 mg into the skin every Tuesday. (Patient not taking: Reported on 12/18/2022)     simvastatin  (ZOCOR ) 20 MG tablet Take 20 mg by mouth daily.     No facility-administered medications prior to visit.     Social History   Socioeconomic History   Marital  status: Single    Spouse name: Not on file   Number of children: 1   Years of education: Not on file   Highest education level: Not on file  Occupational History   Not on file  Tobacco Use   Smoking status: Never   Smokeless tobacco: Never  Vaping Use   Vaping status: Never Used  Substance and Sexual Activity   Alcohol  use: Not Currently    Comment: occasional    Drug use: No   Sexual activity: Not on file  Other Topics Concern   Not on file  Social History Narrative   Not on file   Social Drivers of Health   Financial Resource Strain: Low Risk  (10/05/2017)   Received from [facility], [facility]   Overall Financial Resource Strain (CARDIA)    Difficulty of Paying Living Expenses: Not hard at all  Food Insecurity: No Food Insecurity (10/05/2017)   Received from [facility], [facility]   Hunger Vital Sign    Worried About Running Out of Food in the Last Year: Never true    Ran Out of Food in the Last Year: Never true  Transportation Needs: No Transportation Needs (10/05/2017)   Received from [facility], [facility]   [facility] - Transportation    Lack of Transportation (Medical): No    Lack of Transportation (Non-Medical): No  Physical Activity: Inactive (10/05/2017)   Received from [facility], [facility]   Exercise Vital Sign    Days of Exercise per Week: 0 days    Minutes of Exercise per Session: 0 min  Stress: Not on file  Social Connections: Not on file  Intimate Partner Violence: Not At Risk (10/05/2017)   Received from [facility], [facility]   Humiliation, Afraid, Rape, and Kick questionnaire    Fear of Current or Ex-Partner: No    Emotionally Abused: No    Physically Abused: No    Sexually Abused: No      Review of Systems    All other ros negative   Objective:    BP 134/85   Pulse 82   Resp 16   Ht 5' 5.5 (1.664 m)   Wt 267 lb 14.4 oz (121.5 kg)   SpO2 98%   BMI 43.90 kg/m  Nursing note and vital signs  reviewed.  Physical Exam     General/constitutional: no distress, pleasant HEENT: Normocephalic, PER, Conj Clear, EOMI, Oropharynx clear; missing teeth; no tenderness left jaw line or swelling or sinus  tract Neck supple CV: rrr no mrg Lungs: clear to auscultation, normal respiratory effort Abd: Soft, Nontender Ext: no edema Skin: No Rash Neuro: nonfocal MSK: no peripheral joint swelling/tenderness/warmth; back spines nontender   Central line presence: rue picc site no erythema/purulence-- pulled in clinic   Labs: Awaiting opat labs -- called our [facility] 01/29/23   Micro:  Serology:  Imaging:  Assessment & Plan:   Problem List Items Addressed This Visit   None Visit Diagnoses       Osteomyelitis, unspecified site, unspecified type (HCC)    -  Primary          No orders of the defined types were placed in this encounter.    Esr a little bit up but she feels better with jawline pain and no further allergy  sx Discussed switching dapto to doxy as the latter has some activity against actino if it was present    -continue ertapenem until 01/29/23 -stop daptomycin  today -will notify h[facility] -start doxycline 100 mg bid (avoid dairy, calcium/magnesium/iron within 2 hrs taking doxy; wears sunscreen) -eot still on 01/29/23 -if concern about ongoing infection then, will use doxy/levoflox (allegic to pcn and reaction to ceftriaxone ) -f/u around first week of January with either me or Corean Fireman -can keep picc in until seen by us  again    01/29/2023 id assessment She finishes with her 6 week course today ertapenem and doxy Awaiting opat labs -- not on [facility] She had no sx at this time such as pain (it resolved even before she came. The swelling left submandibular area also had resolved on abx Referral to allergy  immunology for skin pcn testing If recurrent then will place on a long several month course of augmentin   F/u 6-8 weeks with me  ------- Opat labs received  post visit 01/22/23 Cbc 5.5/11.9/341; cr 0.9; lft wnl Ck 78 Sed rate 11 No crp  Follow-up: Return in about 7 weeks (around 03/19/2023).      Constance ONEIDA Passer, MD [facility] 01/29/2023, 3:46 PM

## 2023-01-29 NOTE — Patient Instructions (Signed)
 Please see me in around 6-8 weeks   If relapse will plan a long course of penicillin   I'll refer you to allergy/immunology for penicillin skin testing in case we need to use it  Thank you

## 2023-02-10 DIAGNOSIS — E559 Vitamin D deficiency, unspecified: Secondary | ICD-10-CM | POA: Diagnosis not present

## 2023-02-10 DIAGNOSIS — R946 Abnormal results of thyroid function studies: Secondary | ICD-10-CM | POA: Diagnosis not present

## 2023-02-10 DIAGNOSIS — I10 Essential (primary) hypertension: Secondary | ICD-10-CM | POA: Diagnosis not present

## 2023-02-10 DIAGNOSIS — E7849 Other hyperlipidemia: Secondary | ICD-10-CM | POA: Diagnosis not present

## 2023-02-10 DIAGNOSIS — Z0001 Encounter for general adult medical examination with abnormal findings: Secondary | ICD-10-CM | POA: Diagnosis not present

## 2023-02-10 DIAGNOSIS — E119 Type 2 diabetes mellitus without complications: Secondary | ICD-10-CM | POA: Diagnosis not present

## 2023-02-10 DIAGNOSIS — D649 Anemia, unspecified: Secondary | ICD-10-CM | POA: Diagnosis not present

## 2023-02-10 DIAGNOSIS — E782 Mixed hyperlipidemia: Secondary | ICD-10-CM | POA: Diagnosis not present

## 2023-02-16 DIAGNOSIS — I10 Essential (primary) hypertension: Secondary | ICD-10-CM | POA: Diagnosis not present

## 2023-02-16 DIAGNOSIS — M1711 Unilateral primary osteoarthritis, right knee: Secondary | ICD-10-CM | POA: Diagnosis not present

## 2023-02-16 DIAGNOSIS — F331 Major depressive disorder, recurrent, moderate: Secondary | ICD-10-CM | POA: Diagnosis not present

## 2023-02-16 DIAGNOSIS — Z0000 Encounter for general adult medical examination without abnormal findings: Secondary | ICD-10-CM | POA: Diagnosis not present

## 2023-02-16 DIAGNOSIS — Z6841 Body Mass Index (BMI) 40.0 and over, adult: Secondary | ICD-10-CM | POA: Diagnosis not present

## 2023-02-16 DIAGNOSIS — E559 Vitamin D deficiency, unspecified: Secondary | ICD-10-CM | POA: Diagnosis not present

## 2023-02-23 ENCOUNTER — Encounter: Payer: Self-pay | Admitting: Allergy

## 2023-02-23 ENCOUNTER — Ambulatory Visit: Payer: Medicare PPO | Admitting: Allergy

## 2023-02-23 ENCOUNTER — Telehealth: Payer: Self-pay

## 2023-02-23 ENCOUNTER — Other Ambulatory Visit: Payer: Self-pay

## 2023-02-23 VITALS — BP 128/82 | HR 77 | Temp 97.9°F | Resp 20 | Ht 62.0 in | Wt 266.8 lb

## 2023-02-23 DIAGNOSIS — M869 Osteomyelitis, unspecified: Secondary | ICD-10-CM

## 2023-02-23 DIAGNOSIS — Z880 Allergy status to penicillin: Secondary | ICD-10-CM

## 2023-02-23 NOTE — Patient Instructions (Addendum)
  Penicillin allergy Schedule for penicillin skin testing and drug challenge in the [place] office. Drug challenge instructions You must be off antihistamines for 3-5 days before. Must be in good health and not ill. No vaccines/injections/antibiotics within the past 7 days. Plan on being in the office for 2-3 hours and must bring in the drug you want to do the oral challenge for - will send in prescription to pick up a few days before. You must call to schedule an appointment and specify it's for a drug testing and challenge.    Schedule in our [place] office with Dr. Allena Katz or Dr. Dellis Anes.

## 2023-02-23 NOTE — Telephone Encounter (Signed)
Theresa Horton left a voicemail inquiring about her medical records. I sent the request 1/2 and again 1/24. Medical records sent the needed documents to [facility] via fax 1/27. I successfully faxed them to the office of Dr. Beatris Si. I called pt to inform her but voicemail was full.

## 2023-02-23 NOTE — Progress Notes (Addendum)
New Patient Note  RE: Theresa Horton MRN: [id number] DOB: April 27, 1960 Date of Office Visit: 02/23/2023  Consult requested by: Raymondo Band, MD Primary care provider: Richardean Chimera, MD  Chief Complaint: Establish Care (Would like penicillin testing. Been over 20 years since last reaction but PCP would like verification before prescribing. )  History of Present Illness: I had the pleasure of seeing Theresa Horton for initial evaluation at the [facility] on 02/23/2023. She is a 63 y.o. female, who is referred here by Dr. Renold Don (ID) for the evaluation of penicillin allergy.  Discussed the use of AI scribe software for clinical note transcription with the patient, who gave verbal consent to proceed.  The patient, with a history of penicillin allergy, was referred by her infectious disease doctor due to a recent infection in the jaw bone, which required intravenous antibiotics via a PICC line. The patient reports a reaction to penicillin approximately 20 years ago, which resulted in a significant drop in blood pressure. The exact cause of the reaction remains unclear as the patient was also taking Imodium and another unidentified medication at the time. The patient has since avoided both penicillin and Imodium.  The patient also reports allergies to NSAIDs and ceftriaxone, which have caused rashes in the past. However, she has tolerated Keflex and doxycycline without issue.  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 This medication has led to constipation, managed with stool softeners.   The patient also reports seasonal allergies in the spring and fall, presenting as hay fever symptoms. She denies any history of asthma, food allergies, or bee sting allergies.      Drug reaction: Reaction to penicillin which occurred 20 years ago.  Patient was being treated  for unknown infection. Symptoms started after taking unknown doses. Symptoms include: low BP.  Symptoms persisted for unknown amount of time.  Treatment included: went to ER. Mucosal involvement: no.  Previous work up includes: none. Previous history of rash/hives: yes with ceftriaxone, glipize, glybruine all causes rashes. Nsaids casued swelling.   No issues with doxycycline and keflex.   01/29/2023 ID visit: "Referral to allergy immunology for skin pcn testing If recurrent then will place on a long several month course of augmentin"  Assessment and Plan: Ashara is a 63 y.o. female with: Penicillin allergy Unclear history of reaction to penicillin approximately 20 years ago, presenting with hypotension. No clear recollection of other symptoms such as rash, itching, or mucosal involvement. Patient has been avoiding penicillin and Imodium since the event. Schedule for penicillin skin testing and drug challenge in the [place] office. Reached out to infectious disease to see what dose of Augmentin they want to start.  Augmentin 875mg  BID if she has a relapse.  Drug challenge instructions You must be off antihistamines for 3-5 days before. Must be in good health and not ill. No vaccines/injections/antibiotics within the past 7 days. Plan on being in the office for 2-3 hours and must bring in the drug you want to do the oral challenge for - will send in prescription to pick up a few days before. You must call to schedule an appointment and specify it's for a drug testing and challenge.   Osteomyelitis, unspecified site, unspecified type (HCC) History of infection in the jaw bone, treated with IV antibiotics via a PICC line x 6 weeks. Follow-up with  ID in February as scheduled.     Return for Skin testing, Drug challenge.  No orders of the defined types were placed in this encounter.  Lab Orders  No laboratory test(s) ordered today    Other allergy screening: Asthma: no Rhino  conjunctivitis: slight symptoms in the spring and fall.  Food allergy: no Hymenoptera allergy: no Urticaria: no Eczema:no History of recurrent infections suggestive of immunodeficency: no  Diagnostics: None.    Past Medical History: Patient Active Problem List   Diagnosis Date Noted   S/P total right hip arthroplasty 09/26/2020   OA (osteoarthritis) of hip 05/11/2013   HYPERCHOLESTEROLEMIA  IIA 11/09/2008   Overweight 11/09/2008   Essential hypertension 11/09/2008   PALPITATIONS 11/09/2008   ABNORMAL EKG 11/09/2008   Past Medical History:  Diagnosis Date   Angio-edema    Anxiety    Arthritis    Cancer (HCC)    hxof precancerous cervix- removed    Depression    Diabetes mellitus without complication (HCC)    hx of controlled with diet now    Dysrhythmia    palpitations; evaluated by cardiology in 2009; holter monitor showed NSR   GERD (gastroesophageal reflux disease)    Hypertension    hx of but had 100 lb weight loss    PONV (postoperative nausea and vomiting)    Sleep apnea    hx of no longer has to use CPAP    Urticaria    Past Surgical History: Past Surgical History:  Procedure Laterality Date   BACK SURGERY     x 3- has dropped foot    CARPAL TUNNEL RELEASE     bilateral    cervix conization      KNEE ARTHROSCOPY     bilateral    OOPHORECTOMY     right    TONSILLECTOMY     TOTAL HIP ARTHROPLASTY Left 05/11/2013   Procedure: LEFT TOTAL HIP ARTHROPLASTY ANTERIOR APPROACH;  Surgeon: Loanne Drilling, MD;  Location: [facility];  Service: Orthopedics;  Laterality: Left;   TOTAL HIP ARTHROPLASTY Right 09/26/2020   Procedure: TOTAL HIP ARTHROPLASTY ANTERIOR APPROACH;  Surgeon: Ollen Gross, MD;  Location: [facility];  Service: Orthopedics;  Laterality: Right;    Medication List:  Current Outpatient Medications  Medication Sig Dispense Refill   ARIPiprazole (ABILIFY) 5 MG tablet Take 5 mg by mouth at bedtime.     buPROPion (WELLBUTRIN XL) 300 MG 24 hr  tablet Take 300 mg by mouth daily.     cyclobenzaprine (FLEXERIL) 10 MG tablet Take 10 mg by mouth 3 (three) times daily as needed.     docusate sodium (COLACE) 100 MG capsule Take 100 mg by mouth daily.     DULoxetine (CYMBALTA) 60 MG capsule Take 60 mg by mouth 2 (two) times daily. Pt takes both at nite per preop apapt     gabapentin (NEURONTIN) 600 MG tablet Take 600 mg by mouth 3 (three) times daily as needed (Pain).     HYDROcodone-acetaminophen (NORCO/VICODIN) 5-325 MG tablet Take 1-2 tablets by mouth every 6 (six) hours as needed for severe pain. 42 tablet 0   methocarbamol (ROBAXIN) 500 MG tablet Take 1 tablet (500 mg total) by mouth every 6 (six) hours as needed for muscle spasms. 40 tablet 0   MOUNJARO 10 MG/0.5ML Pen Inject 10 mg into the skin once a week.     omeprazole (PRILOSEC) 20 MG capsule Take 20 mg by mouth daily as needed (Acid reflux).  simvastatin (ZOCOR) 20 MG tablet Take 20 mg by mouth daily.     losartan (COZAAR) 50 MG tablet Take 50 mg by mouth daily.     MELOXICAM PO Take 15 mg by mouth daily.     phentermine (ADIPEX-P) 37.5 MG tablet Take 37.5 mg by mouth daily. (Patient not taking: Reported on 12/18/2022)     rivaroxaban (XARELTO) 10 MG TABS tablet Take 1 tablet (10 mg total) by mouth daily with breakfast. Take for 20 days until completion of prescription, then take one 81 mg aspirin once a day for three weeks. Then discontinue aspirin. (Patient not taking: Reported on 02/23/2023) 20 tablet 0   Semaglutide, 1 MG/DOSE, (OZEMPIC, 1 MG/DOSE,) 2 MG/1.5ML SOPN Inject 1 mg into the skin every Tuesday. (Patient not taking: Reported on 12/18/2022)     No current facility-administered medications for this visit.   Allergies: Allergies  Allergen Reactions   Lipitor [Atorvastatin]     Blood pressure dropped    Loperamide     Other Reaction(s): Unknown   Nsaids Swelling   Penicillins Hives   Sulfa Antibiotics     Mouth had blisters and thrush    Terbinafine And Related      Constipation    Ceftriaxone Rash    Itching rash spreading on arms and thighs after a week of CTX at home. Previously had tolerated PO cephalexin fine.    Glipizide Rash   Glyburide Rash   Nalfon [Fenoprofen Calcium] Swelling and Rash   Social History: Social History   Socioeconomic History   Marital status: Single    Spouse name: Not on file   Number of children: 1   Years of education: Not on file   Highest education level: Not on file  Occupational History   Not on file  Tobacco Use   Smoking status: Never   Smokeless tobacco: Never  Vaping Use   Vaping status: Never Used  Substance and Sexual Activity   Alcohol use: Not Currently    Comment: occasional    Drug use: No   Sexual activity: Not on file  Other Topics Concern   Not on file  Social History Narrative   Not on file   Social Drivers of Health   Financial Resource Strain: Low Risk  (10/05/2017)   Received [facility], [facility]   Overall Financial Resource Strain (CARDIA)    Difficulty of Paying Living Expenses: Not hard at all  Food Insecurity: No Food Insecurity (10/05/2017)   [facility]   Hunger Vital Sign    Worried About Running Out of Food in the Last Year: Never true    Ran Out of Food in the Last Year: Never true  Transportation Needs: No Transportation Needs (10/05/2017)   Received from [facility], [facility]   [facility] - Transportation    Lack of Transportation (Medical): No    Lack of Transportation (Non-Medical): No  Physical Activity: Inactive (10/05/2017)   Received from [facility], [facility]   Exercise Vital Sign    Days of Exercise per Week: 0 days    Minutes of Exercise per Session: 0 min  Stress: Not on file  Social Connections: Not on file   Lives in a house. Smoking: denies Occupation: disabled.   Environmental History: Water Damage/mildew in the house: no Carpet in the family room: no Carpet in the  bedroom: no Heating: electric Cooling: central Pet: no  Family  History: Family History  Problem Relation Age of Onset   Allergic rhinitis Maternal Uncle    Asthma Grandson    Review of Systems  Constitutional:  Negative for appetite change, chills, fever and unexpected weight change.  HENT:  Negative for congestion and rhinorrhea.   Eyes:  Negative for itching.  Respiratory:  Negative for cough, chest tightness, shortness of breath and wheezing.   Cardiovascular:  Negative for chest pain.  Gastrointestinal:  Positive for constipation. Negative for abdominal pain.  Genitourinary:  Negative for difficulty urinating.  Musculoskeletal:  Positive for back pain.  Skin:  Negative for rash.  Neurological:  Negative for headaches.    Objective: BP 128/82 (BP Location: Right Arm, Patient Position: Sitting, Cuff Size: Large)   Pulse 77   Temp 97.9 F (36.6 C) (Temporal)   Resp 20   Ht 5\' 2"  (1.575 m)   Wt 266 lb 12.8 oz (121 kg)   SpO2 95%   BMI 48.80 kg/m  Body mass index is 48.8 kg/m. Physical Exam Vitals and nursing note reviewed.  Constitutional:      Appearance: Normal appearance. She is well-developed.  HENT:     Head: Normocephalic and atraumatic.     Right Ear: Tympanic membrane and external ear normal.     Left Ear: Tympanic membrane and external ear normal.     Nose: Nose normal.     Mouth/Throat:     Mouth: Mucous membranes are moist.     Pharynx: Oropharynx is clear.  Eyes:     Conjunctiva/sclera: Conjunctivae normal.  Cardiovascular:     Rate and Rhythm: Normal rate and regular rhythm.     Heart sounds: Normal heart sounds. No murmur heard.    No friction rub. No gallop.  Pulmonary:     Effort: Pulmonary effort is normal.     Breath sounds: Normal breath sounds. No wheezing, rhonchi or rales.  Musculoskeletal:     Cervical back: Neck supple.  Skin:    General: Skin is warm.     Findings: No rash.  Neurological:     Mental Status: She is alert and  oriented to person, place, and time.  Psychiatric:        Behavior: Behavior normal.   The plan was reviewed with the patient/family, and all questions/concerned were addressed.  It was my pleasure to see Stewart today and participate in her care. Please feel free to contact me with any questions or concerns.  Sincerely,  Wyline Mood, DO Allergy & Immunology  [facility] office: [phone] [facility] office: [phone]

## 2023-03-09 ENCOUNTER — Telehealth: Payer: Self-pay | Admitting: Allergy

## 2023-03-09 ENCOUNTER — Other Ambulatory Visit: Payer: Self-pay | Admitting: Allergy

## 2023-03-09 MED ORDER — AMOXICILLIN-POT CLAVULANATE 875-125 MG PO TABS: ORAL_TABLET | ORAL | 0 refills | Status: AC

## 2023-03-09 MED ORDER — AMOXICILLIN-POT CLAVULANATE 400-57 MG/5ML PO SUSR: ORAL | 0 refills | Status: AC

## 2023-03-09 NOTE — Telephone Encounter (Signed)
 Patient called stating she has a drug challenge on Wednesday. Patient is needing Augmentin  sent in for her challenge. Please advise if you want Augmentin  sent in.

## 2023-03-09 NOTE — Telephone Encounter (Signed)
 Augmentin  liquid and pill sent in.  Do NOT take at home. Bring to allergist's office for in office drug challenge.

## 2023-03-11 ENCOUNTER — Encounter: Payer: Self-pay | Admitting: Allergy & Immunology

## 2023-03-11 ENCOUNTER — Other Ambulatory Visit: Payer: Self-pay

## 2023-03-11 ENCOUNTER — Ambulatory Visit: Payer: Medicare HMO | Admitting: Allergy & Immunology

## 2023-03-11 VITALS — BP 132/80 | HR 70 | Temp 98.2°F | Resp 14 | Wt 264.4 lb

## 2023-03-11 DIAGNOSIS — T782XXD Anaphylactic shock, unspecified, subsequent encounter: Secondary | ICD-10-CM

## 2023-03-11 DIAGNOSIS — Z880 Allergy status to penicillin: Secondary | ICD-10-CM

## 2023-03-11 NOTE — Patient Instructions (Addendum)
1. Penicillin allergy (Primary) - You did VERY WELL with the Augmentin challenge. - I am going to tentatively remove this from your allergy list and let your Infectious Disease doctor know how well you did. - Call us with any problems (we have docs on call 24/7)!   2. Return if symptoms worsen or fail to improve. You can have the follow up appointment with Dr. Dellis Anes or a Nurse Practicioner (our Nurse Practitioners are excellent and always have Physician oversight!).    Please inform us of any Emergency Department visits, hospitalizations, or changes in symptoms. Call us before going to the ED for breathing or allergy symptoms since we might be able to fit you in for a sick visit. Feel free to contact us anytime with any questions, problems, or concerns.  It was a pleasure to meet you today!  Websites that have reliable patient information: 1. American Academy of Asthma, Allergy, and Immunology: www.aaaai.org 2. Food Allergy Research and Education (FARE): foodallergy.org 3. Mothers of Asthmatics: http://www.asthmacommunitynetwork.org 4. American College of Allergy, Asthma, and Immunology: www.acaai.org      "Like" [place] on Facebook and Instagram for our latest updates!      A healthy democracy works best when [employer] participate! Make sure you are registered to vote! If you have moved or changed any of your contact information, you will need to get this updated before voting! Scan the QR codes below to learn more!

## 2023-03-11 NOTE — Progress Notes (Signed)
FOLLOW UP  Date of Service/Encounter:  03/11/23   Assessment:   Penicillin allergy - with negative skin testing in passed Augmentin challenge  Dermatographia  Plan/Recommendations:   1. Penicillin allergy (Primary) - You did VERY WELL with the Augmentin challenge. - I am going to tentatively remove this from your allergy list and let your Infectious Disease doctor know how well you did. - Call us with any problems (we have docs on call 24/7)!   2. Return if symptoms worsen or fail to improve. You can have the follow up appointment with Dr. Dellis Anes or a Nurse Practicioner (our Nurse Practitioners are excellent and always have Physician oversight!).    Subjective:   Theresa Horton is a 63 y.o. female presenting today for follow up of  Chief Complaint  Patient presents with   Food/Drug Challenge    Theresa Horton has a history of the following: Patient Active Problem List   Diagnosis Date Noted   S/P total right hip arthroplasty 09/26/2020   OA (osteoarthritis) of hip 05/11/2013   HYPERCHOLESTEROLEMIA  IIA 11/09/2008   Overweight 11/09/2008   Essential hypertension 11/09/2008   PALPITATIONS 11/09/2008   ABNORMAL EKG 11/09/2008    History obtained from: chart review and patient.  Discussed the use of AI scribe software for clinical note transcription with the patient and/or guardian, who gave verbal consent to proceed.  Theresa Horton is a 63 y.o. female presenting for a drug challenge.  She was last seen in January 2025 by Dr. Selena Batten.  At that time, she had a history of a penicillin allergy 20+ years ago with hypotension, but no clear memory of any other symptoms.  She decided that she needed to undergo penicillin testing and challenge.  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  She has an appointment with Dr. Renold Don on February 20th with [facility].  She experienced low blood pressure approximately twenty years  ago when exposed to penicillin. She is undergoing testing to confirm her current allergy status.  She has a history of jaw infections treated with antibiotics. She previously had a PICC line and was on ertapenem and daptomycin, completing treatment on January 29, 2023. Recently, she was prescribed clindamycin for a tooth that broke off into the jaw, which she has been taking for ten days. One tooth broke off almost to the bottom and another is all the way in the back. 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  She has had sensitive skin for the last three years, with increased sensitivity in the past week. No regular use of antihistamines.     Otherwise, there have been no changes to her past medical history, surgical history, family history, or social history.    Review of systems otherwise negative other than that mentioned in the HPI.    Objective:   Blood pressure (!) 142/90, pulse 91, temperature 98.2 F (36.8 C), resp. rate 12, weight 264 lb 6 oz (119.9 kg), SpO2 95%. Body mass index is 48.35 kg/m.    Physical Exam   Diagnostic studies:     Allergy Studies:     Percutaneous Penicillin Testing Control SPT: negative Histamine SPT: 2+ Pre-Pen SPT: negative Pen-G SPT: negative  Intradermal Penicillin Testing Control ID: negative  Pre-Pen ID: negative Pen-G (50 units/mL) ID: negative Pen-G (500 units/mL) ID: negative Pen-G (5000 units/mL) ID: negative  Testing was negative, therefore we proceeded with an Augmentin challenge.  She received 1% of the dose followed by 10% and 90% of the dose.  Doses were separated by 15 minutes and she was monitored for 1 hour after the last dose.  Total Augmentin dose cumulatively was 875 mg.  Vitals were checked before each subsequent dose was administered.  See scanned flowsheet for more details.  Malachi Bonds, MD [facility]       Malachi Bonds, MD  [facility]

## 2023-03-11 NOTE — Progress Notes (Signed)
Thank you Daine Gravel

## 2023-03-11 NOTE — Addendum Note (Signed)
Addended by: Elsworth Soho on: 03/11/2023 03:54 PM   Modules accepted: Orders

## 2023-03-19 ENCOUNTER — Ambulatory Visit: Payer: Medicare PPO | Admitting: Internal Medicine

## 2023-04-15 ENCOUNTER — Telehealth: Payer: Self-pay

## 2023-04-15 NOTE — Telephone Encounter (Signed)
 Patient called wanting to know if appointment tomorrow is needed. Patient lives an hour away.   Lanell Carpenter Lesli Albee, CMA

## 2023-04-15 NOTE — Telephone Encounter (Signed)
 If she has no concern or sign of infection of her mouth then no  Thanks

## 2023-04-16 ENCOUNTER — Ambulatory Visit: Payer: Medicare PPO | Admitting: Internal Medicine

## 2023-04-16 NOTE — Telephone Encounter (Signed)
 Spoke with patient, she states she is having no problems at all with her mouth. Will cancel appointment for today. Encouraged her to call back for appointment if needed in the future.   Sandie Ano, RN

## 2023-05-26 DIAGNOSIS — I10 Essential (primary) hypertension: Secondary | ICD-10-CM | POA: Diagnosis not present

## 2023-05-26 DIAGNOSIS — D649 Anemia, unspecified: Secondary | ICD-10-CM | POA: Diagnosis not present

## 2023-05-26 DIAGNOSIS — E7849 Other hyperlipidemia: Secondary | ICD-10-CM | POA: Diagnosis not present

## 2023-05-26 DIAGNOSIS — E119 Type 2 diabetes mellitus without complications: Secondary | ICD-10-CM | POA: Diagnosis not present

## 2023-05-26 DIAGNOSIS — R946 Abnormal results of thyroid function studies: Secondary | ICD-10-CM | POA: Diagnosis not present

## 2023-05-26 DIAGNOSIS — E782 Mixed hyperlipidemia: Secondary | ICD-10-CM | POA: Diagnosis not present

## 2023-06-04 DIAGNOSIS — I10 Essential (primary) hypertension: Secondary | ICD-10-CM | POA: Diagnosis not present

## 2023-06-04 DIAGNOSIS — M1711 Unilateral primary osteoarthritis, right knee: Secondary | ICD-10-CM | POA: Diagnosis not present

## 2023-06-04 DIAGNOSIS — E559 Vitamin D deficiency, unspecified: Secondary | ICD-10-CM | POA: Diagnosis not present

## 2023-06-04 DIAGNOSIS — K2100 Gastro-esophageal reflux disease with esophagitis, without bleeding: Secondary | ICD-10-CM | POA: Diagnosis not present

## 2023-06-04 DIAGNOSIS — Z6841 Body Mass Index (BMI) 40.0 and over, adult: Secondary | ICD-10-CM | POA: Diagnosis not present

## 2023-10-02 DIAGNOSIS — Z0001 Encounter for general adult medical examination with abnormal findings: Secondary | ICD-10-CM | POA: Diagnosis not present

## 2023-10-02 DIAGNOSIS — E559 Vitamin D deficiency, unspecified: Secondary | ICD-10-CM | POA: Diagnosis not present

## 2023-10-02 DIAGNOSIS — R946 Abnormal results of thyroid function studies: Secondary | ICD-10-CM | POA: Diagnosis not present

## 2023-10-02 DIAGNOSIS — E7849 Other hyperlipidemia: Secondary | ICD-10-CM | POA: Diagnosis not present

## 2023-10-02 DIAGNOSIS — E119 Type 2 diabetes mellitus without complications: Secondary | ICD-10-CM | POA: Diagnosis not present

## 2023-10-02 DIAGNOSIS — I10 Essential (primary) hypertension: Secondary | ICD-10-CM | POA: Diagnosis not present

## 2023-10-02 DIAGNOSIS — K219 Gastro-esophageal reflux disease without esophagitis: Secondary | ICD-10-CM | POA: Diagnosis not present

## 2023-10-02 DIAGNOSIS — D559 Anemia due to enzyme disorder, unspecified: Secondary | ICD-10-CM | POA: Diagnosis not present

## 2023-10-02 DIAGNOSIS — E1165 Type 2 diabetes mellitus with hyperglycemia: Secondary | ICD-10-CM | POA: Diagnosis not present

## 2023-10-02 DIAGNOSIS — E782 Mixed hyperlipidemia: Secondary | ICD-10-CM | POA: Diagnosis not present

## 2023-10-07 DIAGNOSIS — K2100 Gastro-esophageal reflux disease with esophagitis, without bleeding: Secondary | ICD-10-CM | POA: Diagnosis not present

## 2023-10-07 DIAGNOSIS — M545 Low back pain, unspecified: Secondary | ICD-10-CM | POA: Diagnosis not present

## 2023-10-07 DIAGNOSIS — Z01419 Encounter for gynecological examination (general) (routine) without abnormal findings: Secondary | ICD-10-CM | POA: Diagnosis not present

## 2023-10-07 DIAGNOSIS — Z6841 Body Mass Index (BMI) 40.0 and over, adult: Secondary | ICD-10-CM | POA: Diagnosis not present

## 2023-10-07 DIAGNOSIS — M1711 Unilateral primary osteoarthritis, right knee: Secondary | ICD-10-CM | POA: Diagnosis not present

## 2023-10-07 DIAGNOSIS — I10 Essential (primary) hypertension: Secondary | ICD-10-CM | POA: Diagnosis not present

## 2023-10-13 DIAGNOSIS — Z1231 Encounter for screening mammogram for malignant neoplasm of breast: Secondary | ICD-10-CM | POA: Diagnosis not present
